# Patient Record
Sex: Male | Born: 1975 | Race: Black or African American | Hispanic: No | Marital: Married | Smoking: Never smoker
Health system: Southern US, Community
[De-identification: ages and names within clinical notes are randomized; demographics above are authoritative.]

## PROBLEM LIST (undated history)

## (undated) DIAGNOSIS — I471 Supraventricular tachycardia, unspecified: Secondary | ICD-10-CM

## (undated) DIAGNOSIS — I472 Ventricular tachycardia, unspecified: Secondary | ICD-10-CM

## (undated) HISTORY — PX: OTHER SURGICAL HISTORY: SHX169

---

## 2005-07-25 ENCOUNTER — Emergency Department (HOSPITAL_COMMUNITY): Admission: EM | Admit: 2005-07-25 | Discharge: 2005-07-25 | Payer: Self-pay | Admitting: Family Medicine

## 2006-12-10 ENCOUNTER — Emergency Department (HOSPITAL_COMMUNITY): Admission: EM | Admit: 2006-12-10 | Discharge: 2006-12-10 | Payer: Self-pay | Admitting: Emergency Medicine

## 2013-12-04 ENCOUNTER — Encounter: Payer: Self-pay | Admitting: Physician Assistant

## 2013-12-04 ENCOUNTER — Ambulatory Visit (INDEPENDENT_AMBULATORY_CARE_PROVIDER_SITE_OTHER): Payer: 59 | Admitting: Physician Assistant

## 2013-12-04 VITALS — BP 139/83 | HR 97 | Ht 73.0 in | Wt 264.0 lb

## 2013-12-04 DIAGNOSIS — Z0000 Encounter for general adult medical examination without abnormal findings: Secondary | ICD-10-CM

## 2013-12-04 DIAGNOSIS — Z131 Encounter for screening for diabetes mellitus: Secondary | ICD-10-CM

## 2013-12-04 DIAGNOSIS — Z23 Encounter for immunization: Secondary | ICD-10-CM

## 2013-12-04 DIAGNOSIS — Z13220 Encounter for screening for lipoid disorders: Secondary | ICD-10-CM

## 2013-12-04 NOTE — Patient Instructions (Signed)

## 2013-12-04 NOTE — Progress Notes (Signed)
   Subjective:    Patient ID: Xavier Johnston, male    DOB: 1975/11/28, 38 y.o.   MRN: [id number]  HPI Pt presents to the clinic to establish care and get routine CPE.   PMH negative. On no current medications.   .. Family History  Problem Relation Age of Onset  . Heart attack Father   . Diabetes Maternal Grandmother   . Cancer Paternal Grandmother    .Marland Kitchen History   Social History  . Marital Status: Married    Spouse Name: N/A    Number of Children: N/A  . Years of Education: N/A   Occupational History  . Not on file.   Social History Main Topics  . Smoking status: Never Smoker   . Smokeless tobacco: Not on file  . Alcohol Use: No  . Drug Use: No  . Sexual Activity: Yes   Other Topics Concern  . Not on file   Social History Narrative  . No narrative on file   Pt has no other concerns or complaints today.    Review of Systems  All other systems reviewed and are negative.      Objective:   Physical Exam  BP 139/83  Pulse 97  Ht  (1.854 m)  Wt 264 lb (119.75 kg)  BMI 34.84 kg/m2  General Appearance:    Alert, cooperative, no distress, appears stated age  Head:    Normocephalic, without obvious abnormality, atraumatic  Eyes:    PERRL, conjunctiva/corneas clear, EOM's intact, fundi    benign, both eyes       Ears:    Normal TM's and external ear canals, both ears  Nose:   Nares normal, septum midline, mucosa normal, no drainage    or sinus tenderness  Throat:   Lips, mucosa, and tongue normal; teeth and gums normal  Neck:   Supple, symmetrical, trachea midline, no adenopathy;       thyroid:  No enlargement/tenderness/nodules; no carotid   bruit or JVD  Back:     Symmetric, no curvature, ROM normal, no CVA tenderness  Lungs:     Clear to auscultation bilaterally, respirations unlabored  Chest wall:    No tenderness or deformity  Heart:    Regular rate and rhythm, S1 and S2 normal, no murmur, rub   or gallop  Abdomen:     Soft, non-tender, bowel  sounds active all four quadrants,    no masses, no organomegaly        Extremities:   Extremities normal, atraumatic, no cyanosis or edema  Pulses:   2+ and symmetric all extremities  Skin:   Skin color, texture, turgor normal, no rashes or lesions  Lymph nodes:   Cervical, supraclavicular, and axillary nodes normal  Neurologic:   CNII-XII intact. Normal strength, sensation and reflexes      throughout        Assessment & Plan:  CPE-  Screening labs were given to patient. HO for health maintence. Encouraged exercise and healthy daily diet. Discussed elevated BP. Per pt runs in the 120's over 80's when he checks it during the week.   Tdap given without complication.  Flu shot given without complications.

## 2013-12-08 LAB — COMPLETE METABOLIC PANEL WITH GFR
ALT: 11 U/L (ref 0–53)
AST: 15 U/L (ref 0–37)
Albumin: 4.5 g/dL (ref 3.5–5.2)
Alkaline Phosphatase: 71 U/L (ref 39–117)
BUN: 10 mg/dL (ref 6–23)
CO2: 32 mEq/L (ref 19–32)
CREATININE: 0.92 mg/dL (ref 0.50–1.35)
Calcium: 9.6 mg/dL (ref 8.4–10.5)
Chloride: 103 mEq/L (ref 96–112)
GLUCOSE: 100 mg/dL — AB (ref 70–99)
Potassium: 4 mEq/L (ref 3.5–5.3)
Sodium: 140 mEq/L (ref 135–145)
TOTAL PROTEIN: 7.5 g/dL (ref 6.0–8.3)
Total Bilirubin: 0.4 mg/dL (ref 0.2–1.2)

## 2013-12-08 LAB — LIPID PANEL
Cholesterol: 167 mg/dL (ref 0–200)
HDL: 34 mg/dL — AB (ref >39)
LDL Cholesterol: 101 mg/dL — ABNORMAL HIGH (ref 0–99)
TRIGLYCERIDES: 160 mg/dL — AB (ref <150)
Total CHOL/HDL Ratio: 4.9 Ratio
VLDL: 32 mg/dL (ref 0–40)

## 2014-05-12 ENCOUNTER — Encounter: Payer: Self-pay | Admitting: Physician Assistant

## 2014-05-12 ENCOUNTER — Ambulatory Visit (INDEPENDENT_AMBULATORY_CARE_PROVIDER_SITE_OTHER): Payer: BLUE CROSS/BLUE SHIELD | Admitting: Physician Assistant

## 2014-05-12 VITALS — BP 119/81 | HR 87 | Ht 73.0 in | Wt 258.0 lb

## 2014-05-12 DIAGNOSIS — Z0000 Encounter for general adult medical examination without abnormal findings: Secondary | ICD-10-CM

## 2014-05-12 DIAGNOSIS — Z13220 Encounter for screening for lipoid disorders: Secondary | ICD-10-CM | POA: Diagnosis not present

## 2014-05-12 DIAGNOSIS — Z131 Encounter for screening for diabetes mellitus: Secondary | ICD-10-CM | POA: Diagnosis not present

## 2014-05-12 NOTE — Patient Instructions (Signed)

## 2014-05-12 NOTE — Progress Notes (Signed)
   Subjective:    Patient ID: Xavier Johnston, male    DOB: 09/19/1975, 39 y.o.   MRN: [id number]  HPI Pt presents to the clinic for CPE.  .. Active Ambulatory Problems    Diagnosis Date Noted  . No Active Ambulatory Problems   Resolved Ambulatory Problems    Diagnosis Date Noted  . No Resolved Ambulatory Problems   No Additional Past Medical History   .Marland Kitchen. Family History  Problem Relation Age of Onset  . Heart attack Father   . Diabetes Maternal Grandmother   . Cancer Paternal Grandmother    .Marland Kitchen. History   Social History  . Marital Status: Married    Spouse Name: N/A  . Number of Children: N/A  . Years of Education: N/A   Occupational History  . Not on file.   Social History Main Topics  . Smoking status: Never Smoker   . Smokeless tobacco: Not on file  . Alcohol Use: No  . Drug Use: No  . Sexual Activity: Yes   Other Topics Concern  . Not on file   Social History Narrative   Pt has no compliants today. He is feeling great. Down 6lbs from last year.    Review of Systems  All other systems reviewed and are negative.      Objective:   Physical Exam  BP 119/81 mmHg  Pulse 87  Ht 6\' 1"  (1.854 m)  Wt 258 lb (117.028 kg)  BMI 34.05 kg/m2  General Appearance:    Alert, cooperative, no distress, appears stated age  Head:    Normocephalic, without obvious abnormality, atraumatic  Eyes:    PERRL, conjunctiva/corneas clear, EOM's intact, fundi    benign, both eyes       Ears:    Normal TM's and external ear canals, both ears  Nose:   Nares normal, septum midline, mucosa normal, no drainage    or sinus tenderness  Throat:   Lips, mucosa, and tongue normal; teeth and gums normal  Neck:   Supple, symmetrical, trachea midline, no adenopathy;       thyroid:  No enlargement/tenderness/nodules; no carotid   bruit or JVD  Back:     Symmetric, no curvature, ROM normal, no CVA tenderness  Lungs:     Clear to auscultation bilaterally, respirations unlabored  Chest  wall:    No tenderness or deformity  Heart:    Regular rate and rhythm, S1 and S2 normal, no murmur, rub   or gallop  Abdomen:     Soft, non-tender, bowel sounds active all four quadrants,    no masses, no organomegaly  Genitalia:  Not done.   Rectal:  Not done.  Extremities:   Extremities normal, atraumatic, no cyanosis or edema  Pulses:   2+ and symmetric all extremities  Skin:   Skin color, texture, turgor normal, no rashes or lesions  Lymph nodes:   Cervical, supraclavicular, and axillary nodes normal  Neurologic:   CNII-XII intact. Normal strength, sensation and reflexes      throughout        Assessment & Plan:  cPE- fasting labs were ordered. Vaccines up to date. HIV screening ordered. Discussed additional weight loss and adding in exercise. No urinary symptoms. Will fill out forms for work. HO for CPE given.

## 2014-05-13 ENCOUNTER — Encounter: Payer: Self-pay | Admitting: Physician Assistant

## 2014-05-13 DIAGNOSIS — E785 Hyperlipidemia, unspecified: Secondary | ICD-10-CM | POA: Insufficient documentation

## 2014-05-13 LAB — COMPLETE METABOLIC PANEL WITH GFR
ALK PHOS: 72 U/L (ref 39–117)
ALT: 12 U/L (ref 0–53)
AST: 15 U/L (ref 0–37)
Albumin: 4.6 g/dL (ref 3.5–5.2)
BILIRUBIN TOTAL: 0.5 mg/dL (ref 0.2–1.2)
BUN: 9 mg/dL (ref 6–23)
CO2: 26 mEq/L (ref 19–32)
CREATININE: 0.84 mg/dL (ref 0.50–1.35)
Calcium: 9.5 mg/dL (ref 8.4–10.5)
Chloride: 104 mEq/L (ref 96–112)
GFR, Est African American: >89 mL/min
GFR, Est Non African American: >89 mL/min
Glucose, Bld: 75 mg/dL (ref 70–99)
Potassium: 4.2 mEq/L (ref 3.5–5.3)
SODIUM: 139 meq/L (ref 135–145)
TOTAL PROTEIN: 8 g/dL (ref 6.0–8.3)

## 2014-05-13 LAB — LIPID PANEL
Cholesterol: 190 mg/dL (ref 0–200)
HDL: 37 mg/dL — ABNORMAL LOW (ref >=40)
LDL CALC: 134 mg/dL — AB (ref 0–99)
Total CHOL/HDL Ratio: 5.1 Ratio
Triglycerides: 93 mg/dL (ref <150)
VLDL: 19 mg/dL (ref 0–40)

## 2014-05-13 LAB — HIV ANTIBODY (ROUTINE TESTING W REFLEX): HIV: NONREACTIVE

## 2015-12-20 ENCOUNTER — Encounter: Payer: Self-pay | Admitting: *Deleted

## 2015-12-20 ENCOUNTER — Emergency Department (INDEPENDENT_AMBULATORY_CARE_PROVIDER_SITE_OTHER)
Admission: EM | Admit: 2015-12-20 | Discharge: 2015-12-20 | Disposition: A | Discharge status: Home / Self Care | Payer: BLUE CROSS/BLUE SHIELD | Source: Home / Self Care | Attending: Family Medicine | Admitting: Family Medicine

## 2015-12-20 DIAGNOSIS — J069 Acute upper respiratory infection, unspecified: Secondary | ICD-10-CM

## 2015-12-20 DIAGNOSIS — R51 Headache: Secondary | ICD-10-CM

## 2015-12-20 DIAGNOSIS — R519 Headache, unspecified: Secondary | ICD-10-CM

## 2015-12-20 MED ORDER — PREDNISONE 20 MG PO TABS: ORAL_TABLET | ORAL | 0 refills | Status: DC

## 2015-12-20 MED ORDER — FLUTICASONE PROPIONATE 50 MCG/ACT NA SUSP: 2.0000 | Freq: Every day | NASAL | 2 refills | Status: DC

## 2015-12-20 NOTE — ED Triage Notes (Signed)
Pt c/o gland in the LT side of his neck swollen x 3 days, with soreness behind his LT eye and runny nose since yesterday. He reports receiving a flu vaccine yesterday. Denies fever or vision change. He took Catering manageralka seltzer and sudafed yesterday yesterday.

## 2015-12-20 NOTE — ED Provider Notes (Signed)
CSN: [id number]     Arrival date & time 12/20/15  1847 History   First MD Initiated Contact with Patient 12/20/15 1903     Chief Complaint  Patient presents with  . Eye Pain   (Consider location/radiation/quality/duration/timing/severity/associated sxs/prior Treatment) HPI  Xavier Johnston is a 40 y.o. male presenting to UC with c/o Left sided headache behind his eye, Left sided neck pain with swollen gland that started 3 days ago and mild congestion with rhinorrhea.  He reports getting a flu vaccine yesterday but symptoms started before that.  He has tried OTC sudafed and Alka-seltzer which helps with symptoms but states symptoms come back by the end of the day.  Denies fever, chills, cough, n/v/d. No sick contacts or recent travel.     History reviewed. No pertinent past medical history. History reviewed. No pertinent surgical history. Family History  Problem Relation Age of Onset  . Heart attack Father   . Sudden Cardiac Death Father   . Diabetes Maternal Grandmother   . Cancer Paternal Grandmother    Social History  Substance Use Topics  . Smoking status: Never Smoker  . Smokeless tobacco: Never Used  . Alcohol use No    Review of Systems  Constitutional: Negative for chills and fever.  HENT: Positive for congestion, ear pain ( fullness), rhinorrhea, sinus pressure and sore throat ( scratchy). Negative for trouble swallowing and voice change.   Eyes: Positive for pain ( pain behind Left eye). Negative for photophobia, discharge, redness, itching and visual disturbance.  Respiratory: Negative for cough and shortness of breath.   Cardiovascular: Negative for chest pain and palpitations.  Gastrointestinal: Negative for abdominal pain, diarrhea, nausea and vomiting.  Musculoskeletal: Negative for arthralgias, back pain and myalgias.  Skin: Negative for rash.  Neurological: Positive for headaches (Left side of head). Negative for dizziness and light-headedness.    Allergies   Review of patient's allergies indicates no known allergies.  Home Medications   Prior to Admission medications   Medication Sig Start Date End Date Taking? Authorizing Provider  fluticasone (FLONASE) 50 MCG/ACT nasal spray Place 2 sprays into both nostrils daily. 12/20/15   Junius Finner, PA-C  predniSONE (DELTASONE) 20 MG tablet 3 tabs po day one, then 2 po daily x 4 days 12/20/15   Junius Finner, PA-C   Meds Ordered and Administered this Visit  Medications - No data to display  BP 134/76 (BP Location: Left Arm)   Pulse 79   Temp 98.4 F (36.9 C) (Oral)   Resp 16   Ht 6\' 1"  (1.854 m)   Wt 267 lb (121.1 kg)   SpO2 100%   BMI 35.23 kg/m  No data found.   Physical Exam  Constitutional: He is oriented to person, place, and time. He appears well-developed and well-nourished. No distress.  HENT:  Head: Normocephalic and atraumatic.  Right Ear: A middle ear effusion is present.  Left Ear: A middle ear effusion is present.  Nose: Nose normal. Right sinus exhibits no maxillary sinus tenderness and no frontal sinus tenderness. Left sinus exhibits no maxillary sinus tenderness and no frontal sinus tenderness.  Mouth/Throat: Uvula is midline, oropharynx is clear and moist and mucous membranes are normal.  Eyes: EOM are normal.  Neck: Normal range of motion.  Cardiovascular: Normal rate and regular rhythm.   Pulmonary/Chest: Effort normal and breath sounds normal. No respiratory distress. He has no wheezes. He has no rales.  Musculoskeletal: Normal range of motion.  Neurological: He is alert and  oriented to person, place, and time.  Skin: Skin is warm and dry. He is not diaphoretic.  Psychiatric: He has a normal mood and affect. His behavior is normal.  Nursing note and vitals reviewed.   Urgent Care Course   Clinical Course    Procedures (including critical care time)  Labs Review Labs Reviewed - No data to display  Imaging Review No results found.    MDM   1. Sinus  headache   2. Viral upper respiratory tract infection    No evidence of bacterial infection on exam. Symptoms likely viral in nature. Encouraged symptomatic treatment.  Rx: flonase and prednisone May continue OTC Sudafed Advised pt to use acetaminophen and ibuprofen as needed for fever and pain. Encouraged rest and fluids. F/u with PCP in 7-10 days if not improving, sooner if worsening. Pt verbalized understanding and agreement with tx plan.     Junius FinnerErin O'Malley, PA-C 12/20/15 2021

## 2015-12-20 NOTE — Discharge Instructions (Signed)

## 2019-10-22 ENCOUNTER — Other Ambulatory Visit: Payer: Self-pay

## 2019-10-22 DIAGNOSIS — Z20822 Contact with and (suspected) exposure to covid-19: Secondary | ICD-10-CM | POA: Insufficient documentation

## 2019-10-22 DIAGNOSIS — E669 Obesity, unspecified: Secondary | ICD-10-CM | POA: Insufficient documentation

## 2019-10-22 DIAGNOSIS — I4510 Unspecified right bundle-branch block: Secondary | ICD-10-CM | POA: Insufficient documentation

## 2019-10-22 DIAGNOSIS — R002 Palpitations: Secondary | ICD-10-CM | POA: Diagnosis not present

## 2019-10-22 DIAGNOSIS — I472 Ventricular tachycardia: Secondary | ICD-10-CM | POA: Diagnosis not present

## 2019-10-22 NOTE — ED Triage Notes (Signed)
Pt states about 20 minutes ago he started feeling like his heart was racing and he got hot and sweaty  Denies any pain

## 2019-10-23 ENCOUNTER — Observation Stay (HOSPITAL_BASED_OUTPATIENT_CLINIC_OR_DEPARTMENT_OTHER): Payer: BC Managed Care – PPO

## 2019-10-23 ENCOUNTER — Observation Stay (HOSPITAL_BASED_OUTPATIENT_CLINIC_OR_DEPARTMENT_OTHER)
Admission: EM | Admit: 2019-10-23 | Discharge: 2019-10-23 | Disposition: A | Discharge status: Home / Self Care | Payer: BC Managed Care – PPO | Attending: Cardiology | Admitting: Cardiology

## 2019-10-23 ENCOUNTER — Encounter (HOSPITAL_BASED_OUTPATIENT_CLINIC_OR_DEPARTMENT_OTHER): Payer: Self-pay | Admitting: Emergency Medicine

## 2019-10-23 DIAGNOSIS — R000 Tachycardia, unspecified: Secondary | ICD-10-CM

## 2019-10-23 DIAGNOSIS — I2489 Other forms of acute ischemic heart disease: Secondary | ICD-10-CM

## 2019-10-23 DIAGNOSIS — I248 Other forms of acute ischemic heart disease: Secondary | ICD-10-CM | POA: Diagnosis not present

## 2019-10-23 DIAGNOSIS — R9431 Abnormal electrocardiogram [ECG] [EKG]: Secondary | ICD-10-CM | POA: Diagnosis not present

## 2019-10-23 DIAGNOSIS — I4510 Unspecified right bundle-branch block: Secondary | ICD-10-CM | POA: Diagnosis not present

## 2019-10-23 DIAGNOSIS — I472 Ventricular tachycardia: Secondary | ICD-10-CM

## 2019-10-23 LAB — BASIC METABOLIC PANEL
Anion gap: 11 (ref 5–15)
BUN: 16 mg/dL (ref 6–20)
CO2: 27 mmol/L (ref 22–32)
Calcium: 9.8 mg/dL (ref 8.9–10.3)
Chloride: 101 mmol/L (ref 98–111)
Creatinine, Ser: 1.16 mg/dL (ref 0.61–1.24)
GFR calc Af Amer: >60 mL/min (ref >60)
GFR calc non Af Amer: >60 mL/min (ref >60)
Glucose, Bld: 116 mg/dL — ABNORMAL HIGH (ref 70–99)
Potassium: 3.6 mmol/L (ref 3.5–5.1)
Sodium: 139 mmol/L (ref 135–145)

## 2019-10-23 LAB — CBC WITH DIFFERENTIAL/PLATELET
Abs Immature Granulocytes: 0.02 10*3/uL (ref 0.00–0.07)
Basophils Absolute: 0.1 10*3/uL (ref 0.0–0.1)
Basophils Relative: 1 %
Eosinophils Absolute: 0.2 10*3/uL (ref 0.0–0.5)
Eosinophils Relative: 2 %
HCT: 42.9 % (ref 39.0–52.0)
Hemoglobin: 13.8 g/dL (ref 13.0–17.0)
Immature Granulocytes: 0 %
Lymphocytes Relative: 44 %
Lymphs Abs: 4.6 10*3/uL — ABNORMAL HIGH (ref 0.7–4.0)
MCH: 26.8 pg (ref 26.0–34.0)
MCHC: 32.2 g/dL (ref 30.0–36.0)
MCV: 83.5 fL (ref 80.0–100.0)
Monocytes Absolute: 0.8 10*3/uL (ref 0.1–1.0)
Monocytes Relative: 8 %
Neutro Abs: 5 10*3/uL (ref 1.7–7.7)
Neutrophils Relative %: 45 %
Platelets: 302 10*3/uL (ref 150–400)
RBC: 5.14 MIL/uL (ref 4.22–5.81)
RDW: 14.5 % (ref 11.5–15.5)
WBC: 10.7 10*3/uL — ABNORMAL HIGH (ref 4.0–10.5)
nRBC: 0 % (ref 0.0–0.2)

## 2019-10-23 LAB — ECHOCARDIOGRAM COMPLETE
Area-P 1/2: 3.03 cm2
Height: 73 in
S' Lateral: 2.8 cm
Weight: 4611.2 oz

## 2019-10-23 LAB — SARS CORONAVIRUS 2 BY RT PCR (HOSPITAL ORDER, PERFORMED IN ~~LOC~~ HOSPITAL LAB): SARS Coronavirus 2: NEGATIVE

## 2019-10-23 LAB — TSH: TSH: 1.964 u[IU]/mL (ref 0.350–4.500)

## 2019-10-23 LAB — TROPONIN I (HIGH SENSITIVITY)
Troponin I (High Sensitivity): 42 ng/L — ABNORMAL HIGH (ref <18)
Troponin I (High Sensitivity): 5 ng/L (ref <18)

## 2019-10-23 LAB — MAGNESIUM: Magnesium: 2.3 mg/dL (ref 1.7–2.4)

## 2019-10-23 MED ORDER — NITROGLYCERIN 0.4 MG SL SUBL: 0.4000 mg | SUBLINGUAL_TABLET | SUBLINGUAL | Status: DC | PRN

## 2019-10-23 MED ORDER — ASPIRIN EC 81 MG PO TBEC: 81.0000 mg | DELAYED_RELEASE_TABLET | Freq: Every day | ORAL | Status: DC

## 2019-10-23 MED ORDER — ONDANSETRON HCL 4 MG/2ML IJ SOLN: 4.0000 mg | Freq: Four times a day (QID) | INTRAMUSCULAR | Status: DC | PRN

## 2019-10-23 MED ORDER — ETOMIDATE 2 MG/ML IV SOLN: 0.1500 mg/kg | Freq: Once | INTRAVENOUS | Status: DC

## 2019-10-23 MED ORDER — ACETAMINOPHEN 325 MG PO TABS: 650.0000 mg | ORAL_TABLET | ORAL | Status: DC | PRN

## 2019-10-23 MED ORDER — ASPIRIN 300 MG RE SUPP: 300.0000 mg | RECTAL | Status: AC

## 2019-10-23 MED ORDER — ASPIRIN 81 MG PO CHEW
324.0000 mg | CHEWABLE_TABLET | ORAL | Status: AC
  Administered 2019-10-23: 324 mg via ORAL
  Filled 2019-10-23: qty 4

## 2019-10-23 MED ORDER — METOPROLOL SUCCINATE ER 50 MG PO TB24
50.0000 mg | ORAL_TABLET | Freq: Every day | ORAL | 6 refills | Status: DC
Start: 2019-10-23 — End: 2019-12-14

## 2019-10-23 NOTE — Plan of Care (Signed)
Pt stable. No complications other than last night SVT episode.

## 2019-10-23 NOTE — Progress Notes (Signed)
  Echocardiogram 2D Echocardiogram has been performed.  Xavier Johnston 10/23/2019, 1:44 PM

## 2019-10-23 NOTE — ED Notes (Signed)
Canceled sedation at this time - pt converted to sinus tach

## 2019-10-23 NOTE — ED Provider Notes (Addendum)
[facility] Provider Note: Xavier Dell, MD, FACEP  CSN: [id number] MRN: [id number] ARRIVAL: 10/22/19 at 2351 ROOM: MH06/[facility]06   CHIEF COMPLAINT  Palpitations   HISTORY OF PRESENT ILLNESS  10/23/19 12:05 AM Xavier Johnston is a 44 y.o. male without significant past medical history.  About 20 minutes prior to arrival he is felt his heart suddenly racing and he got hot and sweaty.  He did not have any chest pain or shortness of breath.  On arrival his EKG showed a wide complex tachycardia with a rate of 217, it is unclear if this represented ventricular tachycardia or SVT with aberrancy.  Preparations were being made for emergency cardioversion but he spontaneously converted to either sinus rhythm with right bundle branch block or atrial flutter with 2-1 block, I favor the former.  He is not aware of anything that brought this on nor did anything make his symptoms better or worse.  He denies any drug use.   History reviewed. No pertinent past medical history.  Past Surgical History:  Procedure Laterality Date  . vastectomy      Family History  Problem Relation Age of Onset  . Heart attack Father   . Sudden Cardiac Death Father   . Diabetes Maternal Grandmother   . Cancer Paternal Grandmother     Social History   Tobacco Use  . Smoking status: Never Smoker  . Smokeless tobacco: Never Used  Vaping Use  . Vaping Use: Never used  Substance Use Topics  . Alcohol use: No  . Drug use: No    Prior to Admission medications   Medication Sig Start Date End Date Taking? Authorizing Provider  fluticasone (FLONASE) 50 MCG/ACT nasal spray Place 2 sprays into both nostrils daily. 12/20/15   Lurene Shadow, PA-C  predniSONE (DELTASONE) 20 MG tablet 3 tabs po day one, then 2 po daily x 4 days 12/20/15   Lurene Shadow, PA-C    Allergies Patient has no known allergies.   REVIEW OF SYSTEMS  Negative except as noted here or in the History of Present  Illness.   PHYSICAL EXAMINATION  Initial Vital Signs Blood pressure (!) 172/114, pulse 77, temperature 98.7 F (37.1 C), temperature source Oral, resp. rate 20, height 6\' 1"  (1.854 m), weight 129.3 kg, SpO2 100 %.  Examination General: Well-developed, well-nourished male in no acute distress; appearance consistent with age of record HENT: normocephalic; atraumatic Eyes: pupils equal, round and reactive to light; extraocular muscles intact Neck: supple Heart: regular rate and rhythm; tachycardia Lungs: clear to auscultation bilaterally Abdomen: soft; nondistended; nontender; bowel sounds present Extremities: No deformity; full range of motion; pulses normal Neurologic: Awake, alert and oriented; motor function intact in all extremities and symmetric; no facial droop Skin: Diaphoretic Psychiatric: Anxious   RESULTS  Summary of this visit's results, reviewed and interpreted by myself:   EKG Interpretation  Date/Time:  Thursday October 22 2019 23:58:38 EDT Ventricular Rate:  217 PR Interval:    QRS Duration: 142 QT Interval:  252 QTC Calculation: 479 R Axis:   166 Text Interpretation:  Critical Test Result: Arrhythmia  Suspect arm lead reversal, interpretation assumes no reversal Wide QRS tachycardia Right bundle branch block Septal infarct , age undetermined Lateral infarct , age undetermined Abnormal ECG No previous ECGs available Confirmed by [id number] (Paula Libra) on 10/23/2019 12:03:29 AM       EKG Interpretation  Date/Time:  Friday October 23 2019 00:07:23 EDT Ventricular Rate:  119 PR Interval:  QRS Duration: 125 QT Interval:  328 QTC Calculation: 462 R Axis:   -101 Text Interpretation: Undetermined rhythm Right bundle branch block ST elev, probable normal early repol pattern Previously Wide-Complex Tachycardia Confirmed by Paula Libra ([id number]) on 10/23/2019 12:15:11 AM       Laboratory Studies: Results for orders placed or performed during the hospital encounter of  10/23/19 (from the past 24 hour(s))  CBC with Differential/Platelet     Status: Abnormal   Collection Time: 10/23/19 12:08 AM  Result Value Ref Range   WBC 10.7 (H) 4.0 - 10.5 K/uL   RBC 5.14 4.22 - 5.81 MIL/uL   Hemoglobin 13.8 13.0 - 17.0 g/dL   HCT 37.2 39 - 52 %   MCV 83.5 80.0 - 100.0 fL   MCH 26.8 26.0 - 34.0 pg   MCHC 32.2 30.0 - 36.0 g/dL   RDW 90.2 11.1 - 55.2 %   Platelets 302 150 - 400 K/uL   nRBC 0.0 0.0 - 0.2 %   Neutrophils Relative % 45 %   Neutro Abs 5.0 1.7 - 7.7 K/uL   Lymphocytes Relative 44 %   Lymphs Abs 4.6 (H) 0.7 - 4.0 K/uL   Monocytes Relative 8 %   Monocytes Absolute 0.8 0 - 1 K/uL   Eosinophils Relative 2 %   Eosinophils Absolute 0.2 0 - 0 K/uL   Basophils Relative 1 %   Basophils Absolute 0.1 0 - 0 K/uL   Immature Granulocytes 0 %   Abs Immature Granulocytes 0.02 0.00 - 0.07 K/uL  Basic metabolic panel     Status: Abnormal   Collection Time: 10/23/19 12:08 AM  Result Value Ref Range   Sodium 139 135 - 145 mmol/L   Potassium 3.6 3.5 - 5.1 mmol/L   Chloride 101 98 - 111 mmol/L   CO2 27 22 - 32 mmol/L   Glucose, Bld 116 (H) 70 - 99 mg/dL   BUN 16 6 - 20 mg/dL   Creatinine, Ser 0.80 0.61 - 1.24 mg/dL   Calcium 9.8 8.9 - 22.3 mg/dL   GFR calc non Af Amer >60 >60 mL/min   GFR calc Af Amer >60 >60 mL/min   Anion gap 11 5 - 15  Troponin I (High Sensitivity)     Status: None   Collection Time: 10/23/19 12:08 AM  Result Value Ref Range   Troponin I (High Sensitivity) 5 <18 ng/L  Magnesium     Status: None   Collection Time: 10/23/19 12:08 AM  Result Value Ref Range   Magnesium 2.3 1.7 - 2.4 mg/dL  Troponin I (High Sensitivity)     Status: Abnormal   Collection Time: 10/23/19  2:30 AM  Result Value Ref Range   Troponin I (High Sensitivity) 42 (H) <18 ng/L   Imaging Studies: No results found.  ED COURSE and MDM  Nursing notes, initial and subsequent vitals signs, including pulse oximetry, reviewed and interpreted by myself.  Vitals:    10/23/19 0145 10/23/19 0200 10/23/19 0215 10/23/19 0300  BP: (!) 133/98 (!) 127/92 (!) 132/99 (!) 126/95  Pulse: (!) 110 (!) 112 (!) 108 (!) 103  Resp:  16 (!) 22 (!) 21  Temp:      TempSrc:      SpO2: 98% 100% 99% 97%  Weight:      Height:       Medications - No data to display  2:42 AM Patient has remained in sinus tachycardia with right bundle branch block.  Discussed with Dr. Charna Busman  of cardiology.  She will admit for overnight observation as it is unclear exactly what tachyarrhythmia he was in.  She agrees that this was likely SVT with aberrancy.  3:35 AM Elevated troponin is consistent with demand ischemia as tachycardia.  PROCEDURES  Procedures   ED DIAGNOSES     ICD-10-CM   1. Wide-complex tachycardia (HCC)  I47.2   2. RBBB  I45.10   3. Demand ischemia (HCC)  I24.8        Rayleigh Gillyard, MD 10/23/19 0246    Paula Libra, MD 10/23/19 [phone]

## 2019-10-23 NOTE — Discharge Summary (Signed)
ELECTROPHYSIOLOGY PROCEDURE DISCHARGE SUMMARY    Patient ID: Xavier Johnston,  MRN: [id number], DOB/AGE: 1975-10-23 44 y.o.  Admit date: 10/23/2019 Discharge date: 10/23/2019  Primary Care Physician: Maud Deed, PA  Primary Cardiologist/Electrophysiologist: new to [facility], Dr. Lalla Brothers  Primary Discharge Diagnosis:  1. Palpitations 2. WCT  Secondary Discharge Diagnosis:  1. obesity  No Known Allergies   Procedures This Admission:  none  Brief HPI: Xavier Johnston is a 44 y.o. male  with no noted PMHx with sudden onset of palpitations sought attention at the ER, he was noted in a WCT at 207bpm, while being prepared for cardioversion the tachycardia spontaneously converted to ST.  He was transferred to [facility] for EP evaluation.  Hospital Course:  The patient reported He was seated last night wrapping up the evening talking with his wife, went to get a blanket from the closet and his heart started to race.  He felt a bit winded with it, no CP.  He went to sit to see if it would settle, is did not, broke into a cold sweat.  His wife mentions profusely sweating.  At no time did he feel lightheaded or weak, after about 20 minutes decided to seek attention.  While in the [facility] ER they were discussing with him plans to cardiovert him and then commented that the rhythm had broke.  He did at that time feel like he did feel better, and could tell.  He reports diaphoresis, but no dizziness, near syncope or syncope.  His BP elevated on arrival to the ER  He was admitted and evaluated by Dr. Lalla Brothers.  His labs largely unremarkable except his HS trop 5 > 42. He had no CP and these were suspect to be demand ischemia 2/2 to about 45 minutes of extreme tachycardia.  His post conversion EKG with RBBB like morhology. His WCT suspect to most likely represent an SVT, though Dr. Lalla Brothers discussed unable to know for sure by his EKG alone and recommended out patient follow up with EP study and coronary  CT to f/u on his abnormal HS Trop  TTE was done noting LVEF 60-65%, no WMA, no VHD, mild dilation of AO root 82mm, he feels well with no further arrhythmias He is being discharged on Toprol XL 50mg  daily, f/u is in place.   TPhysical Exam: Vitals:   10/23/19 0645 10/23/19 0700 10/23/19 0844 10/23/19 1100  BP: (!) 101/59 (!) 101/58 (!) 134/97 123/86  Pulse: 80 78 87 83  Resp: 16 16 18    Temp:   98 F (36.7 C)   TempSrc:   Tympanic   SpO2: 97% 97% 99% 100%  Weight:   130.7 kg   Height:   6\' 1"  (1.854 m)      Labs:   Lab Results  Component Value Date   WBC 10.7 (H) 10/23/2019   HGB 13.8 10/23/2019   HCT 42.9 10/23/2019   MCV 83.5 10/23/2019   PLT 302 10/23/2019    Recent Labs  Lab 10/23/19 0008  NA 139  K 3.6  CL 101  CO2 27  BUN 16  CREATININE 1.16  CALCIUM 9.8  GLUCOSE 116*    Discharge Medications:  Allergies as of 10/23/2019   No Known Allergies     Medication List    TAKE these medications   fluticasone 50 MCG/ACT nasal spray Commonly known as: FLONASE Place 2 sprays into both nostrils daily.   metoprolol succinate 50 MG 24 hr tablet Commonly  known as: Toprol XL Take 1 tablet (50 mg total) by mouth daily. Take with or immediately following a meal.   naproxen 250 MG tablet Commonly known as: NAPROSYN Take 500 mg by mouth 2 (two) times daily with a meal.   One A Day Mens VitaCraves Chew Chew 2 tablets by mouth daily.       Disposition: Home Discharge Instructions    Diet - low sodium heart healthy   Complete by: As directed    Increase activity slowly   Complete by: As directed       Follow-up Information    Lanier Prude, MD Follow up.   Specialties: Interventional Cardiology, Cardiology Why: 11/16/2019 @ 9:20AM Contact information: [address] [place] [address] [phone]               Duration of Discharge Encounter: Greater than 30 minutes including physician time.  Norma Fredrickson,  PA-C 10/23/2019 4:34 PM

## 2019-10-23 NOTE — Plan of Care (Signed)
Pt discharging home.

## 2019-10-23 NOTE — Progress Notes (Signed)
Patient transferred via [facility] at 0844hrs.  Oriented to unit and plan of care for shift.  Patient verbalized understanding.  Cardiology notified of arrival.

## 2019-10-23 NOTE — H&P (Addendum)
Cardiology Admission History and Physical:   Patient ID: Xavier Johnston MRN: [id number]; DOB: 10/24/1975   Admission date: 10/23/2019  Primary Care Provider: Maud Deed, PA [facility] Cardiologist: [facility] Electrophysiologist:  None , new to [facility]  Chief Complaint:  palpitations  Patient Profile:   Xavier Johnston is a 43 y.o. male with no noted PMHx with sudden onset of palpitations.  History of Present Illness:   Xavier Johnston had a usual day for him yesterday, no unusual meals or activities.  He works from home, did a few things during the day, though nothing unusual or stressful.  He skipped lunch but had a normal dinner.  NO unusual caffeine intake, no ETOH.  No new medicines or vitamins of any kind. He denies any smoking or drugs, he drinks ETOH infrequently  He was seated last night wrapping up the evening talking with his wife, went to get a blanket from the closet and his heart started to race.  He felt a bit winded with it, no CP.  He went to sit to see if it would settle, is did not, broke into a cold sweat.  His wife mentions profusely sweating.  At no time did he feel lightheaded or weak, after about 20 minutes decided to seek attention.  While in the [facility] ER they were discussing with him plans to cardiovert him and then commented that the rhythm had broke.  He did at that time feel like he did feel better, and could tell.  He reports diaphoresis, but no dizziness, near syncope or syncope.  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  He and his wife try to walk for exercise in their neighborhood thought this is on again off again.  He does make efforts to park far away at stores to get some steps in.  He denies any kind of CP, DOE or difficulties with his ADLS.  No CP with his tachycardia or since.  He had a stress echo in 2010-07-27, this was after the death of his father who was in his early 66's from  heart disease (by autopsy) and he wanted to get his heart checked.  No other cardiac evaluatins.  He takes no home medicine regularly   LABS K+ 3.6 Mag 2.3 HS trop 5 > 42 WBC 10.7 H/H 13/42 Plts 302  COVID neg   History reviewed. No pertinent past medical history.  Past Surgical History:  Procedure Laterality Date  . vastectomy       Medications Prior to Admission: Prior to Admission medications   Medication Sig Start Date End Date Taking? Authorizing Provider  Multiple Vitamins-Minerals (ONE A DAY MENS VITACRAVES) CHEW Chew 2 tablets by mouth daily.   Yes [provider]  naproxen (NAPROSYN) 250 MG tablet Take 500 mg by mouth 2 (two) times daily with a meal.   Yes [provider]  fluticasone (FLONASE) 50 MCG/ACT nasal spray Place 2 sprays into both nostrils daily. Patient not taking: Reported on 10/23/2019 12/20/15   Lurene Shadow, PA-C     Allergies:   No Known Allergies  Social History:   Social History   Socioeconomic History  . Marital status: Married    Spouse name: Not on file  . Number of children: Not on file  . Years of education: Not on file  . Highest education level: Not on file  Occupational History  .  Not on file  Tobacco Use  . Smoking status: Never Smoker  . Smokeless tobacco: Never Used  Vaping Use  . Vaping Use: Never used  Substance and Sexual Activity  . Alcohol use: No  . Drug use: No  . Sexual activity: Yes  Other Topics Concern  . Not on file  Social History Narrative  . Not on file   Social Determinants of Health   Financial Resource Strain:   . Difficulty of Paying Living Expenses:   Food Insecurity:   . Worried About Programme researcher, broadcasting/film/video in the Last Year:   . Barista in the Last Year:   Transportation Needs:   . Freight forwarder (Medical):   Marland Kitchen Lack of Transportation (Non-Medical):   Physical Activity:   . Days of Exercise per Week:   . Minutes of Exercise per Session:   Stress:   .  Feeling of Stress :   Social Connections:   . Frequency of Communication with Friends and Family:   . Frequency of Social Gatherings with Friends and Family:   . Attends Religious Services:   . Active Member of Clubs or Organizations:   . Attends Banker Meetings:   Marland Kitchen Marital Status:   Intimate Partner Violence:   . Fear of Current or Ex-Partner:   . Emotionally Abused:   Marland Kitchen Physically Abused:   . Sexually Abused:     Family History:   The patient's family history includes Cancer in his paternal grandmother; Diabetes in his maternal grandmother; Heart attack in his father; Sudden Cardiac Death in his father.    ROS:  Please see the history of present illness.  All other ROS reviewed and negative.     Physical Exam/Data:   Vitals:   10/23/19 0630 10/23/19 0645 10/23/19 0700 10/23/19 0844  BP: 112/61 (!) 101/59 (!) 101/58 (!) 134/97  Pulse: 81 80 78 87  Resp: 17 16 16 18   Temp:    98 F (36.7 C)  TempSrc:    Tympanic  SpO2: 98% 97% 97% 99%  Weight:    130.7 kg  Height:    6\' 1"  (1.854 m)   No intake or output data in the 24 hours ending 10/23/19 1030 Last 3 Weights 10/23/2019 10/22/2019 12/20/2015  Weight (lbs) 288 lb 3.2 oz 285 lb 267 lb  Weight (kg) 130.727 kg 129.275 kg 121.11 kg     Body mass index is 38.02 kg/m.  General:  Well nourished, well developed, in no acute distress HEENT: normal Lymph: no adenopathy Neck: no JVD Endocrine:  No thryomegaly Vascular: No carotid bruits Cardiac:  RRR; no murmurs, gallops or rubs Lungs:  CTA b/l, no wheezing, rhonchi or rales  Abd: soft, nontender, obese  Ext: no  edema Musculoskeletal:  No deformities Skin: warm and dry  Neuro:  no gross focal motor abnormalities noted Psych:  Normal affect    EKG:  personally reviewed and demonstrates  WCT 217bpm, RBBB ST 119bpm, RBBB, I do not appreciate any pre-excitation  06/10/2010: SR, normal intervals, no pre-excitation  Telemetry  SR 70's-80's  Relevant CV  Studies:  07/06/2010 stress echo LVEF 55-60% at rest, no VHD Stress EF 60-65%, no echo evidence of CAD  Laboratory Data:  High Sensitivity Troponin:   Recent Labs  Lab 10/23/19 0008 10/23/19 0230  TROPONINIHS 5 42*      Chemistry Recent Labs  Lab 10/23/19 0008  NA 139  K 3.6  CL 101  CO2 27  GLUCOSE 116*  BUN 16  CREATININE 1.16  CALCIUM 9.8  GFRNONAA >60  GFRAA >60  ANIONGAP 11    No results for input(s): PROT, ALBUMIN, AST, ALT, ALKPHOS, BILITOT in the last 168 hours. Hematology Recent Labs  Lab 10/23/19 0008  WBC 10.7*  RBC 5.14  HGB 13.8  HCT 42.9  MCV 83.5  MCH 26.8  MCHC 32.2  RDW 14.5  PLT 302   BNPNo results for input(s): BNP, PROBNP in the last 168 hours.  DDimer No results for input(s): DDIMER in the last 168 hours.   Radiology/Studies:  No results found. {  Assessment and Plan:   1. WCT 2. RBBB (new from 2012) 3. Abnormal HS Trop  Suspect an SVT though not certain HS trop likely 2/2 45 minutes of HR 200's, no symptoms of angina Get echo Dr. Lalla Brothers will see the patient later today   Severity of Illness: The appropriate patient status for this patient is OBSERVATION. Observation status is judged to be reasonable and necessary in order to provide the required intensity of service to ensure the patient's safety. The patient's presenting symptoms, physical exam findings, and initial radiographic and laboratory data in the context of their medical condition is felt to place them at decreased risk for further clinical deterioration. Furthermore, it is anticipated that the patient will be medically stable for discharge from the hospital within 2 midnights of admission. The following factors support the patient status of observation.      For questions or updates, please contact [facility] Please consult [email or web address] for contact info under     Signed, Sheilah Pigeon, PA-C  10/23/2019 10:30 AM

## 2019-10-23 NOTE — Discharge Instructions (Signed)
Left Bundle Branch Block  Left bundle branch block (LBBB) is a problem with the way that electrical impulses pass through the heart (electrical conduction abnormality). The heart depends on an electrical pulse to beat normally. The electrical signal for a heartbeat starts in the upper chambers of the heart (atria) and then travels to the two lower chambers (left and rightventricles). An LBBB is a partial or complete block of the pathway that carries the signal to the left ventricle. If you have LBBB, the left side of your heart does not beat normally. LBBB may be a warning sign of heart disease. What are the causes? This condition may be caused by:  Heart disease.  Disease of the arteries in the heart (arteriosclerosis).  Stiffening or weakening of heart muscle (cardiomyopathy).  Infection of heart muscle (myocarditis).  High blood pressure. In some cases, the cause may not be known. What increases the risk? The following factors may make you more likely to develop this condition:  Being male.  Being 42 years of age or older.  Having heart disease.  Having had a heart attack or heart surgery. What are the signs or symptoms? This condition may not cause any symptoms. If you do have symptoms, they may include:  Feeling dizzy or light-headed.  Fainting. How is this diagnosed? This condition may be diagnosed based on an electrocardiogram (ECG). It is often diagnosed when an ECG is done as part of a routine physical or to help find the cause of fainting spells. You may also have imaging tests to find out more about your condition. These may include:  Chest X-rays.  Echocardiogram. How is this treated? If you do not have symptoms or any other type of heart disease, you may not need treatment for this condition. However, you may need to see your health care provider more often because LBBB can be a warning sign of future heart problems. You may get treatment for other heart  problems or high blood pressure. If LBBB causes symptoms or other heart problems, you may need to have an electrical device (pacemaker) implanted under the skin of your chest. A pacemaker sends electrical signals to your heart to keep it beating normally. Follow these instructions at home: Lifestyle   Follow instructions from your health care provider about eating or drinking restrictions.  Follow a heart-healthy diet and maintain a healthy weight. Work with a dietitian to create an eating plan that is best for you.  Do not use any products that contain nicotine or tobacco, such as cigarettes, e-cigarettes, and chewing tobacco. If you need help quitting, ask your health care provider. Activity  Get regular exercise as told by your health care provider.  Return to your normal activities as told by your health care provider. Ask your health care provider what activities are safe for you. General instructions  Take over-the-counter and prescription medicines only as told by your health care provider.  Keep all follow-up visits as told by your health care provider. This is important. Contact a health care provider if:  You feel light-headed.  You faint. Get help right away if:  You have chest pain.  You have trouble breathing. These symptoms may represent a serious problem that is an emergency. Do not wait to see if the symptoms will go away. Get medical help right away. Call your local emergency services (911 in the [place] Do not drive yourself to the hospital. Summary  For the heart to beat normally, an electrical signal  must travel to the lower left chamber of the heart. Left bundle branch block (LBBB) is a partial or complete block of the pathway that carries that signal.  This condition may not cause any symptoms. In some cases, a person may feel dizzy or light-headed or may faint.  Treatment may not be needed for LBBB if you do not have symptoms or any other type of heart  disease.  You may need to see your health care provider more often because LBBB can be a warning sign of future heart problems. This information is not intended to replace advice given to you by your health care provider. Make sure you discuss any questions you have with your health care provider. Document Revised: 09/02/2018 Document Reviewed: 09/02/2018 Elsevier Patient Education  2020 [employer]   Right Bundle Branch Block  Right bundle branch block (RBBB) is a problem with the way that electrical impulses pass through the heart (electrical conduction abnormality). The heart depends on an electrical pulse to beat normally. The electrical signal for a heartbeat starts in the upper chambers of the heart (atria) and then travels to the two lower chambers (left and rightventricles). An RBBB is a partial or complete block of the pathway that carries the signal to the right ventricle. If you have RBBB, the right side of your heart beats a little more slowly than the left side. RBBB may be a warning of heart disease or a lung problem. What are the causes? This condition may be caused by:  Heart attack (myocardial infarction).  Being born with a heart defect (congenital heart disease).  A blood clot that flows into the lung (pulmonary embolism).  Infection of heart muscle (myocarditis).  High blood pressure. In some cases, the cause may not be known. What increases the risk? The following factors may make you more likely to develop this condition:  Being male.  Being 74 years of age or older.  Having heart disease.  Having had a heart attack or heart surgery.  Having an enlarged heart.  Having a hole in the walls between the chambers of the heart (septal defect). What are the signs or symptoms? This condition does not typically cause symptoms. How is this diagnosed? This condition may be diagnosed based on an electrocardiogram (ECG). It is often diagnosed when an ECG is done  as part of a routine physical. You may also have imaging tests to find out more about your condition. These may include:  Chest X-rays.  Echocardiogram. How is this treated? Treatment may not be needed for this condition if you do not have symptoms or any other heart problems. However, you may need to see your health care provider more often because RBBB can be a warning sign of future heart or lung problems. You may need treatment for another condition that may be causing RBBB. Follow these instructions at home: Lifestyle   Follow instructions from your health care provider about eating or drinking restrictions.  Follow a heart-healthy diet and maintain a healthy weight. Work with a dietitian to create an eating plan that is best for you.  Do not use any products that contain nicotine or tobacco, such as cigarettes, e-cigarettes, and chewing tobacco. If you need help quitting, ask your health care provider. Activity  Get regular exercise as told by your health care provider.  Return to your normal activities as told by your health care provider. Ask your health care provider what activities are safe for you. General instructions  Take over-the-counter and prescription medicines only as told by your health care provider.  Keep all follow-up visits as told by your health care provider. This is important. Contact a health care provider if:  You are light-headed.  You faint. Get help right away if:  You have chest pain.  You have trouble breathing. These symptoms may represent a serious problem that is an emergency. Do not wait to see if the symptoms will go away. Get medical help right away. Call your local emergency services (911 in the [place] Do not drive yourself to the hospital.  Summary  For the heart to beat normally, an electrical signal must travel to the heart's lower right chamber. Right bundle branch block (RBBB) is a partial or complete block of the pathway that  carries that signal.  This condition does not typically cause symptoms.  Treatment may not be needed for RBBB if you do not have symptoms or any other heart problems.  You may need to see your health care provider more often because RBBB can be a warning sign of future heart or lung problems. This information is not intended to replace advice given to you by your health care provider. Make sure you discuss any questions you have with your health care provider. Document Revised: 09/02/2018 Document Reviewed: 09/02/2018 Elsevier Patient Education  2020 [employer]   Ventricular Tachycardia  Ventricular tachycardia is a fast heartbeat that begins in the lower chambers of the heart (ventricles). It is a type of abnormal heart rhythm (arrhythmia). A normal heartbeat usually starts when an area in the heart called the sinoatrial (SA) node releases an electrical signal. With ventricular tachycardia, electrical signals in the lower part of the heart fire abnormally and interfere with the electrical signals sent out by the SA node. A normal heart rate is 60-100 beats per minute. During an episode of ventricular tachycardia, the heart reaches 100 beats per minute or higher. This condition can be life-threatening and should be treated immediately. What are the causes? This condition is caused by abnormal electrical activity in the lower part of the heart. This may result from:  Medicines.  Diseases of the heart muscle (cardiomyopathy).  The heart not getting enough oxygen. This may be caused by blood flow problems in the arteries.  An inflammatory disease that affects multiple areas of the body (sarcoidosis).  Drug use, such as cocaine, amphetamine, or anabolic steroid use. What increases the risk? You are more likely to develop this condition if:  You have had a heart attack.  You have: ? Heart failure or cardiomyopathy. ? Heart defects that you were born with (congenital heart  defects). ? Abnormal heart tissue. ? Heart valves that leak or are narrow. ? Diabetes. ? An infection that affects the heart. ? High blood pressure. ? An overactive or underactive thyroid. ? Sleep apnea. ? A family history of stopped heartbeat (cardiac arrest) or coronary artery disease. ? High cholesterol.  You smoke.  You drink alcohol heavily.  You use drugs, such as cocaine. What are the signs or symptoms? Symptoms of this condition include:  A pounding heartbeat.  Feeling as if your heart is skipping beats or fluttering (palpitations).  Shortness of breath.  Anxiety.  Dizziness.  Light-headedness.  Fainting.  Chest pain.  Cardiac arrest caused by an irregular heartbeat (arrhythmia). How is this diagnosed? This condition may be diagnosed based on:  Your symptoms and medical history.  A physical exam.  Electrocardiogram (ECG). This test is done to  check for problems with electrical activity in the heart.  Holter monitor or event monitor test. This test involves wearing a portable device that monitors your heart rate over time. You may also have other tests, including:  Blood tests.  Chest X-ray.  Echocardiogram. This test involves using sound waves to create images of the heart.  Angiogram. During this test, dye is injected into your bloodstream, and then X-rays are taken. The dye lets your health care provider see how blood flows through your arteries.  Exercise stress test. During this test, an ECG is done while you exercise on a treadmill.  Cardiac CT scan or cardiac MRI. How is this treated? Treatment for this condition depends on the cause. Treatment may include:  Medicines that slow the heart rate and return it to a normal rhythm (anti-arrhythmics).  An electric shock (cardioversion) that makes the heart go back to a normal rhythm.  An electrophysiology study. This procedure can help locate areas of heart tissue that are causing rapid  heartbeats. ? In this procedure, a thin, flexible tube (catheter) is inserted into one of your veins and moved to your heart to evaluate your heart's electrical activity. ? In some cases, the heart tissue that is causing problems may be killed with radiofrequency energy delivered through the catheter (radiofrequency ablation). This may help your heart keep a normal rhythm.  An implantable cardioverter defibrillator (ICD). This is a small device that monitors your heartbeat. When it senses an irregular heartbeat, it sends a shock to bring the heartbeat back to normal. The ICD is implanted under the skin in the chest.  Surgery to improve blood flow to the heart.  Genetic counseling to check whether your family members are at risk for ventricular tachycardia. Follow these instructions at home: Lifestyle  Do not use any products that contain nicotine or tobacco, such as cigarettes and e-cigarettes. If you need help quitting, ask your health care provider.  Do not use stimulant drugs, such as cocaine or methamphetamines.  Maintain a healthy weight.  Manage stress. Try to do this with relaxation exercises, yoga, quiet time, or meditation. Eating and drinking  Eat a healthy diet. This includes plenty of fruits and vegetables, whole grains, lean meats, and low-fat or fat-free dairy products.  Avoid eating foods that are high in saturated fat, trans fat, sugar, or salt (sodium).  Ask your health care provider if you may drink alcohol. ? If alcohol triggers episodes of ventricular tachycardia, do not drink alcohol. ? If alcohol does not trigger episodes, limit alcohol intake to no more than 1 drink a day for nonpregnant women and 2 drinks a day for men. One drink equals 12 oz of beer, 5 oz of wine, or 1 oz of hard liquor. General instructions  Take over-the-counter and prescription medicines only as told by your health care provider.  Exercise regularly. Aim for 150 minutes of moderate  exercise or 75 minutes of vigorous exercise per week. Ask your health care provider what exercises are safe for you.  Keep all follow-up visits as told by your health care provider. This is important. Contact a health care provider if:  Your symptoms get worse.  You develop new symptoms, such as new palpitations.  You feel depressed. Get help right away if:  You have an episode of ventricular tachycardia that lasts 30 seconds or more.  You have chest pain.  You have trouble breathing. These symptoms may represent a serious problem that is an emergency. Do not wait  to see if the symptoms will go away. Get medical help right away. Call your local emergency services (911 in the [place] Do not drive yourself to the hospital. Summary  Ventricular tachycardia is a fast heartbeat that begins in the lower chambers of the heart. This condition can be life-threatening and should be treated immediately.  This condition may be treated with medicines, electric shock, radiofrequency energy, surgery, or insertion of an implantable cardioverter defibrillator (ICD).  Get help right away if you have chest pain, trouble breathing, or ventricular tachycardia symptoms that last more than 30 seconds. This information is not intended to replace advice given to you by your health care provider. Make sure you discuss any questions you have with your health care provider. Document Revised: 02/15/2017 Document Reviewed: 04/26/2016 Elsevier Patient Education  2020 [employer] Inc[employer]   Postural Orthostatic Tachycardia Syndrome Postural orthostatic tachycardia syndrome (POTS) is a group of symptoms that occur when a person stands up after lying down. POTS occurs when less blood than normal flows to the body when you stand up. The reduced blood flow to the body makes the heart beat rapidly. POTS may be associated with another medical condition, or it may occur on its own. What are the causes? The cause of this  condition is not known, but many conditions and diseases are associated with it. What increases the risk? This condition is more likely to develop in:  Women 30-23 years old.  Women who are pregnant.  Women who are in their period (menstruating).  People who have certain conditions, such as: ? Infection from a virus. ? Attacks of healthy organs by the body's immunity (autoimmune disease). ? Losing a lot of red blood cells (anemia). ? Losing too much water in the body (dehydration). ? An overactive thyroid (hyperthyroidism).  People who take certain medicines.  People who have had a major injury.  People who have had surgery. What are the signs or symptoms? The most common symptom of this condition is light-headedness when one stands from a lying or sitting position. Other symptoms may include:  Feeling a rapid increase in the heartbeat (tachycardia) within 10 minutes of standing up.  Fainting.  Weakness.  Confusion.  Trembling.  Shortness of breath.  Sweating or flushing.  Headache.  Chest pain.  Breathing that is deeper and faster than normal (hyperventilation).  Nausea.  Anxiety. Symptoms may be worse in the morning, and they may be relieved by lying down. How is this diagnosed? This condition is diagnosed based on:  Your symptoms.  Your medical history.  A physical exam.  Checking your heart rate when you are lying down and after you stand up.  Checking your blood pressure when you go from lying down to standing up.  Blood tests to measure hormones that change with blood pressure. The blood tests will be done when you are lying down and when you are standing up. You may have other tests to check for conditions or diseases that are associated with POTS. How is this treated? Treatment for this condition depends on how severe your symptoms are and whether you have any conditions or diseases that are associated with POTS. Treatment may  involve:  Treating any conditions or diseases that are associated with POTS.  Drinking two glasses of water before getting up from a lying position.  Eating more salt (sodium).  Taking medicine to control blood pressure and heart rate (beta-blocker).  Avoiding certain medicines.  Starting an exercise program under the supervision of  a health care provider. Follow these instructions at home: Medicines  Take over-the-counter and prescription medicines only as told by your health care provider.  Let your health care provider know about all prescription or over-the-counter medicines. These include herbs, vitamins, and supplements. You may need to stop or adjust some medicines if they cause this condition.  Talk with your health care provider before starting any new medicines. Eating and drinking   Drink enough fluid to keep your urine pale yellow.  If told by your health care provider, drink two glasses of water before getting up from a lying position.  Follow instructions from your health care provider about how much sodium you should eat.  Avoid heavy meals. Eat several small meals a day instead of a few large meals. General instructions  Do an aerobic exercise for 20 minutes a day, at least 3 days a week.  Ask your health care provider what kinds of exercise are safe for you.  Do not use any products that contain nicotine or tobacco, such as cigarettes and e-cigarettes. These can interfere with blood flow. If you need help quitting, ask your health care provider.  Keep all follow-up visits as told by your health care provider. This is important. Contact a health care provider if:  Your symptoms do not improve after treatment.  Your symptoms get worse.  You develop new symptoms. Get help right away if:  You have chest pain.  You have difficulty breathing.  You have fainting episodes. These symptoms may represent a serious problem that is an emergency. Do not wait  to see if the symptoms will go away. Get medical help right away. Call your local emergency services (911 in the [place] Do not drive yourself to the hospital. Summary  POTS is a condition that can cause light-headedness, fainting, and palpitations when you go from a sitting or lying position to a standing position. It occurs when less blood than normal flows to the body when you stand up.  Treatment for this condition includes treating any underlying conditions, drinking plenty of water, stopping or changing some medicines, or starting an exercise program.  Get help right away if you have chest pain, difficulty breathing, or fainting episodes. These may represent a serious problem that is an emergency. This information is not intended to replace advice given to you by your health care provider. Make sure you discuss any questions you have with your health care provider. Document Revised: 04/16/2017 Document Reviewed: 04/16/2017 Elsevier Patient Education  2020 [employer]   Sinus Tachycardia  Sinus tachycardia is a kind of fast heartbeat. In sinus tachycardia, the heart beats more than 100 times a minute. Sinus tachycardia starts in a part of the heart called the sinus node. Sinus tachycardia may be harmless, or it may be a sign of a serious condition. What are the causes? This condition may be caused by:  Exercise or exertion.  A fever.  Pain.  Loss of body fluids (dehydration).  Severe bleeding (hemorrhage).  Anxiety and stress.  Certain substances, including: ? Alcohol. ? Caffeine. ? Tobacco and nicotine products. ? Cold medicines. ? Illegal drugs.  Medical conditions including: ? Heart disease. ? An infection. ? An overactive thyroid (hyperthyroidism). ? A lack of red blood cells (anemia). What are the signs or symptoms? Symptoms of this condition include:  A feeling that the heart is beating quickly (palpitations).  Suddenly noticing your heartbeat (cardiac  awareness).  Dizziness.  Tiredness (fatigue).  Shortness of breath.  Chest pain.  Nausea.  Fainting. How is this diagnosed? This condition is diagnosed with:  A physical exam.  Other tests, such as: ? Blood tests. ? An electrocardiogram (ECG). This test measures the electrical activity of the heart. ? Ambulatory cardiac monitor. This records your heartbeats for 24 hours or more. You may be referred to a heart specialist (cardiologist). How is this treated? Treatment for this condition depends on the cause or the underlying condition. Treatment may involve:  Treating the underlying condition.  Taking new medicines or changing your current medicines as told by your health care provider.  Making changes to your diet or lifestyle. Follow these instructions at home: Lifestyle   Do not use any products that contain nicotine or tobacco, such as cigarettes and e-cigarettes. If you need help quitting, ask your health care provider.  Do not use illegal drugs, such as cocaine.  Learn relaxation methods to help you when you get stressed or anxious. These include deep breathing.  Avoid caffeine or other stimulants. Alcohol use   Do not drink alcohol if: ? Your health care provider tells you not to drink. ? You are pregnant, may be pregnant, or are planning to become pregnant.  If you drink alcohol, limit how much you have: ? 0-1 drink a day for women. ? 0-2 drinks a day for men.  Be aware of how much alcohol is in your drink. In the [place] one drink equals one typical bottle of beer (12 oz), one-half glass of wine (5 oz), or one shot of hard liquor (1 oz). General instructions  Drink enough fluids to keep your urine pale yellow.  Take over-the-counter and prescription medicines only as told by your health care provider.  Keep all follow-up visits as told by your health care provider. This is important. Contact a health care provider if you have:  A fever.  Vomiting  or diarrhea that does not go away. Get help right away if you:  Have pain in your chest, upper arms, jaw, or neck.  Become weak or dizzy.  Feel faint.  Have palpitations that do not go away. Summary  In sinus tachycardia, the heart beats more than 100 times a minute.  Sinus tachycardia may be harmless, or it may be a sign of a serious condition.  Treatment for this condition depends on the cause or the underlying condition.  Get help right away if you have pain in your chest, upper arms, jaw, or neck. This information is not intended to replace advice given to you by your health care provider. Make sure you discuss any questions you have with your health care provider. Document Revised: 04/24/2017 Document Reviewed: 04/24/2017 Elsevier Patient Education  2020 [employer]. You will be called by Dr. Lovena Neighbours nurse Victorino Dike) to arrange your Cat Scan

## 2019-10-26 ENCOUNTER — Encounter: Payer: Self-pay | Admitting: Cardiology

## 2019-10-26 ENCOUNTER — Telehealth: Payer: Self-pay | Admitting: Cardiology

## 2019-10-26 DIAGNOSIS — I248 Other forms of acute ischemic heart disease: Secondary | ICD-10-CM

## 2019-10-26 DIAGNOSIS — R000 Tachycardia, unspecified: Secondary | ICD-10-CM

## 2019-10-26 DIAGNOSIS — I472 Ventricular tachycardia: Secondary | ICD-10-CM

## 2019-10-26 NOTE — Telephone Encounter (Signed)
Returned call to pt.  Advised ok to give FMLA paperwork to our office for follow up.  Advised can provide work letter on Wednesday 10/28/19.  Pt will have wife pick up letter.

## 2019-10-26 NOTE — Telephone Encounter (Signed)
Yes, will sign FMLA for this past hospitalization and will put a letter in for him and I can sign it on Wednesday as well.Riccardo Dubin

## 2019-10-26 NOTE — Telephone Encounter (Signed)
New message   Pt has two questions. He would like to know if he could use Dr. Lalla Brothers on his FMLA papers since that is the doctor he has been working with? And if he could have a note to write him out of work for the rest of the week for his episode last week?

## 2019-10-28 ENCOUNTER — Telehealth: Payer: Self-pay | Admitting: Cardiology

## 2019-10-28 NOTE — Telephone Encounter (Signed)
Met with Pt today.  Gave return to work letter and received [employer].  Advised Pt that Dr. Quentin Ore would like Pt to have a stress test to evaluate for potential blockages.  Pt and wife indicate understanding.

## 2019-10-28 NOTE — Telephone Encounter (Signed)
FMLA paperwork received, completed, & was signed by Dr. Lalla Brothers.  Form was faxed to [facility] 10/28/19  KLM

## 2019-10-28 NOTE — Telephone Encounter (Signed)
FMLA paperwork faxed as requested.

## 2019-10-29 ENCOUNTER — Telehealth: Payer: Self-pay | Admitting: Cardiology

## 2019-10-29 NOTE — Telephone Encounter (Signed)
Patient calling stating he has his intermittent FMLA paperwork and would like to drop it off at the office for someone today.

## 2019-11-02 ENCOUNTER — Telehealth: Payer: Self-pay | Admitting: Cardiology

## 2019-11-02 NOTE — Telephone Encounter (Signed)
Patient calling for Xavier Johnston. He did not want me to take a message.

## 2019-11-02 NOTE — Telephone Encounter (Signed)
Returned call to Pt.  Calling to advise he is dropping off intermittent FMLA paperwork today.

## 2019-11-04 ENCOUNTER — Telehealth: Payer: Self-pay | Admitting: Cardiology

## 2019-11-04 NOTE — Telephone Encounter (Signed)
FMLA paperwork faxed as requested.  Paperwork given to Medical Records.

## 2019-11-04 NOTE — Telephone Encounter (Signed)
FMLA paperwork received, completed and signed by Dr. Lalla Brothers. Form was faxed to [facility]. 11/04/19 vlm

## 2019-11-13 ENCOUNTER — Other Ambulatory Visit (HOSPITAL_COMMUNITY)
Admission: RE | Admit: 2019-11-13 | Discharge: 2019-11-13 | Disposition: A | Discharge status: Home / Self Care | Payer: BC Managed Care – PPO | Source: Ambulatory Visit | Attending: Cardiology | Admitting: Cardiology

## 2019-11-13 DIAGNOSIS — Z01812 Encounter for preprocedural laboratory examination: Secondary | ICD-10-CM | POA: Insufficient documentation

## 2019-11-13 DIAGNOSIS — Z20822 Contact with and (suspected) exposure to covid-19: Secondary | ICD-10-CM | POA: Diagnosis not present

## 2019-11-13 LAB — SARS CORONAVIRUS 2 (TAT 6-24 HRS): SARS Coronavirus 2: NEGATIVE

## 2019-11-16 ENCOUNTER — Ambulatory Visit: Payer: BC Managed Care – PPO | Admitting: Cardiology

## 2019-11-17 ENCOUNTER — Other Ambulatory Visit: Payer: Self-pay

## 2019-11-17 ENCOUNTER — Other Ambulatory Visit: Payer: Self-pay | Admitting: Cardiology

## 2019-11-17 ENCOUNTER — Ambulatory Visit (INDEPENDENT_AMBULATORY_CARE_PROVIDER_SITE_OTHER): Payer: BC Managed Care – PPO

## 2019-11-17 DIAGNOSIS — I472 Ventricular tachycardia: Secondary | ICD-10-CM

## 2019-11-17 DIAGNOSIS — I248 Other forms of acute ischemic heart disease: Secondary | ICD-10-CM | POA: Diagnosis not present

## 2019-11-17 DIAGNOSIS — I4510 Unspecified right bundle-branch block: Secondary | ICD-10-CM

## 2019-11-17 DIAGNOSIS — R000 Tachycardia, unspecified: Secondary | ICD-10-CM

## 2019-11-17 LAB — EXERCISE TOLERANCE TEST
Estimated workload: 10.4 METS
Exercise duration (min): 8 min
Exercise duration (sec): 2 s
MPHR: 178 {beats}/min
Peak HR: 155 {beats}/min
Percent HR: 87 %
RPE: 20
Rest HR: 67 {beats}/min

## 2019-11-25 NOTE — Progress Notes (Signed)
Electrophysiology Office Note:    Date:  11/26/2019   ID:  [name], DOB 1975/07/26, MRN [id number]  PCP:  Maud Deed, PA  [facility] Cardiologist:  No primary care provider on file.  [facility] Electrophysiologist:  Lanier Prude, MD   Referring MD: Maud Deed, PA   Chief Complaint: Wide complex tachycardia  History of Present Illness:    Xavier Johnston is a 44 y.o. male with no past medical history presents as a follow up after recent hospitalization for an episode of wide complex tachycardia.   History reviewed. No pertinent past medical history.  Past Surgical History:  Procedure Laterality Date  . vastectomy      Current Medications: Current Meds  Medication Sig  . metoprolol succinate (TOPROL XL) 50 MG 24 hr tablet Take 1 tablet (50 mg total) by mouth daily. Take with or immediately following a meal.  . Multiple Vitamins-Minerals (ONE A DAY MENS VITACRAVES) CHEW Chew 2 tablets by mouth daily.  . naproxen (NAPROSYN) 250 MG tablet Take 500 mg by mouth as needed.      Allergies:   Patient has no known allergies.   Social History   Socioeconomic History  . Marital status: Married    Spouse name: Not on file  . Number of children: Not on file  . Years of education: Not on file  . Highest education level: Not on file  Occupational History  . Not on file  Tobacco Use  . Smoking status: Never Smoker  . Smokeless tobacco: Never Used  Vaping Use  . Vaping Use: Never used  Substance and Sexual Activity  . Alcohol use: No  . Drug use: No  . Sexual activity: Yes  Other Topics Concern  . Not on file  Social History Narrative  . Not on file   Social Determinants of Health   Financial Resource Strain:   . Difficulty of Paying Living Expenses: Not on file  Food Insecurity:   . Worried About Programme researcher, broadcasting/film/video in the Last Year: Not on file  . Ran Out of Food in the Last Year: Not on file  Transportation Needs:   . Lack of  Transportation (Medical): Not on file  . Lack of Transportation (Non-Medical): Not on file  Physical Activity:   . Days of Exercise per Week: Not on file  . Minutes of Exercise per Session: Not on file  Stress:   . Feeling of Stress : Not on file  Social Connections:   . Frequency of Communication with Friends and Family: Not on file  . Frequency of Social Gatherings with Friends and Family: Not on file  . Attends Religious Services: Not on file  . Active Member of Clubs or Organizations: Not on file  . Attends Banker Meetings: Not on file  . Marital Status: Not on file     Family History: The patient's family history includes Cancer in his paternal grandmother; Diabetes in his maternal grandmother; Heart attack in his father; Sudden Cardiac Death in his father.  ROS:   Please see the history of present illness.    All other systems reviewed and are negative.  EKGs/Labs/Other Studies Reviewed:    The following studies were reviewed today:  11/17/2019 Exercise Treadmill  Blood pressure demonstrated a normal response to exercise.  There was no ST segment deviation noted during stress.   Negative ETT Patient only achieved 87% PMHR Rare PVC;s in peak stress/recovery Normal hemodynamic response  10/23/2019 Echo 1. Left ventricular ejection fraction, by estimation, is 60 to 65%. The  left ventricle has normal function. The left ventricle has no regional  wall motion abnormalities. Left ventricular diastolic parameters were  normal.  2. Right ventricular systolic function is normal. The right ventricular  size is normal.  3. The mitral valve is normal in structure. No evidence of mitral valve  regurgitation. No evidence of mitral stenosis.  4. The aortic valve is tricuspid. Aortic valve regurgitation is not  visualized. No aortic stenosis is present.  5. Aortic dilatation noted. 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  6. The  inferior vena cava is normal in size with greater than 50%  respiratory variability, suggesting right atrial pressure of 3 mmHg.    10/22/2019 ECG: RBBB tachycardia at 220 bpm.     EKG:  The ekg ordered today demonstrates sinus rhythm with a first degree AV delay.  Recent Labs: 10/23/2019: BUN 16; Creatinine, Ser 1.16; Hemoglobin 13.8; Magnesium 2.3; Platelets 302; Potassium 3.6; Sodium 139; TSH 1.964  Recent Lipid Panel    Component Value Date/Time   CHOL 190 05/12/2014 1333   TRIG 93 05/12/2014 1333   HDL 37 (L) 05/12/2014 1333   CHOLHDL 5.1 05/12/2014 1333   VLDL 19 05/12/2014 1333   LDLCALC 134 (H) 05/12/2014 1333    Physical Exam:    VS:  BP 124/76   Pulse 65   Ht 6\' 1"  (1.854 m)   Wt 285 lb (129.3 kg)   SpO2 98%   BMI 37.60 kg/m     Wt Readings from Last 3 Encounters:  11/26/19 285 lb (129.3 kg)  10/23/19 288 lb 3.2 oz (130.7 kg)  12/20/15 267 lb (121.1 kg)     GEN:  Well nourished, well developed in no acute distress HEENT: Normal NECK: No JVD; No carotid bruits LYMPHATICS: No lymphadenopathy CARDIAC: RRR, no murmurs, rubs, gallops RESPIRATORY:  Clear to auscultation without rales, wheezing or rhonchi  ABDOMEN: Soft, non-tender, non-distended MUSCULOSKELETAL:  No edema; No deformity  SKIN: Warm and dry NEUROLOGIC:  Alert and oriented x 3 PSYCHIATRIC:  Normal affect   ASSESSMENT:    1. Wide-complex tachycardia (HCC)    PLAN:    In order of problems listed above:  1. Wide complex tachycardia ECG on 8/5 shows a typical right bundle branch block consistent with an SVT with aberrant conduction. No evidence of preexcitation on today's ECG. Intervals are normal. Disucssed management options with the patient today including EP study and ablation vs continued medical management. Given his desire to stop taking the metoprolol, the patient wants to pursue definitive management with EPS and ablation. I discussed the risks and benefits with the patient at length and  he is agreeable to proceed. I also discussed the possibility that we may bring for EP study and him not have an inducible arrhythmia.  Risk, benefits, and alternatives to EP study and radiofrequency ablation for SVT were also discussed in detail today. These risks include but are not limited to complete heart block, stroke, bleeding, vascular damage, tamponade, perforation, and death. The patient understands these risk and wishes to proceed.  We will therefore proceed with catheter ablation at the next available time.  Carto and ICE are requested for the procedure.    He will hold metoprolol 5 days prior to the procedure.  Medication Adjustments/Labs and Tests Ordered: Current medicines are reviewed at length with the patient today.  Concerns regarding medicines are outlined  above.  Orders Placed This Encounter  Procedures  . EKG 12-Lead   No orders of the defined types were placed in this encounter.   Patient Instructions  Medication Instructions:  Your physician recommends that you continue on your current medications as directed. Please refer to the Current Medication list given to you today.  *If you need a refill on your cardiac medications before your next appointment, please call your pharmacy*  Lab Work: None ordered.  If you have labs (blood work) drawn today and your tests are completely normal, you will receive your results only by: Marland Kitchen MyChart Message (if you have MyChart) OR . A paper copy in the mail If you have any lab test that is abnormal or we need to change your treatment, we will call you to review the results.  Testing/Procedures: Your physician has recommended that you have an ablation. Catheter ablation is a medical procedure used to treat some cardiac arrhythmias (irregular heartbeats). During catheter ablation, a long, thin, flexible tube is put into a blood vessel in your groin (upper thigh), or neck. This tube is called an ablation catheter. It is then guided to  your heart through the blood vessel. Radio frequency waves destroy small areas of heart tissue where abnormal heartbeats may cause an arrhythmia to start. Please see the instruction sheet given to you today.   Follow-Up: At [facility] you and your health needs are our priority.  As part of our continuing mission to provide you with exceptional heart care, we have created designated Provider Care Teams.  These Care Teams include your primary Cardiologist (physician) and Advanced Practice Providers (APPs -  Physician Assistants and Nurse Practitioners) who all work together to provide you with the care you need, when you need it.  We recommend signing up for the patient portal called "MyChart".  Sign up information is provided on this After Visit Summary.  MyChart is used to connect with patients for Virtual Visits (Telemedicine).  Patients are able to view lab/test results, encounter notes, upcoming appointments, etc.  Non-urgent messages can be sent to your provider as well.   To learn more about what you can do with MyChart, go to [email or web address].     Other Instructions:     Signed, Steffanie Dunn, MD, [facility]  11/26/2019 10:10 AM    Electrophysiology [facility]    Anticoagulation instructions: The patient is not on anticoagulation.  Medication instructions morning of: The patient should hold ALL medications the morning of the procedure . The patient should hold their metoprolol for 5 days prior to the procedure.  Discharge: Our plan will be to discharge the patient same day after a period of observation

## 2019-11-26 ENCOUNTER — Encounter: Payer: Self-pay | Admitting: *Deleted

## 2019-11-26 ENCOUNTER — Encounter: Payer: Self-pay | Admitting: Cardiology

## 2019-11-26 ENCOUNTER — Other Ambulatory Visit: Payer: Self-pay

## 2019-11-26 ENCOUNTER — Other Ambulatory Visit: Payer: BC Managed Care – PPO

## 2019-11-26 ENCOUNTER — Ambulatory Visit: Payer: BC Managed Care – PPO | Admitting: Cardiology

## 2019-11-26 VITALS — BP 124/76 | HR 65 | Ht 73.0 in | Wt 285.0 lb

## 2019-11-26 DIAGNOSIS — I472 Ventricular tachycardia: Secondary | ICD-10-CM | POA: Diagnosis not present

## 2019-11-26 DIAGNOSIS — R000 Tachycardia, unspecified: Secondary | ICD-10-CM

## 2019-11-26 NOTE — Patient Instructions (Addendum)
Medication Instructions:  Your physician recommends that you continue on your current medications as directed. Please refer to the Current Medication list given to you today.  *If you need a refill on your cardiac medications before your next appointment, please call your pharmacy*  Lab Work: None ordered.  If you have labs (blood work) drawn today and your tests are completely normal, you will receive your results only by: . MyChart Message (if you have MyChart) OR . A paper copy in the mail If you have any lab test that is abnormal or we need to change your treatment, we will call you to review the results.  Testing/Procedures: Your physician has recommended that you have an ablation. Catheter ablation is a medical procedure used to treat some cardiac arrhythmias (irregular heartbeats). During catheter ablation, a long, thin, flexible tube is put into a blood vessel in your groin (upper thigh), or neck. This tube is called an ablation catheter. It is then guided to your heart through the blood vessel. Radio frequency waves destroy small areas of heart tissue where abnormal heartbeats may cause an arrhythmia to start. Please see the instruction sheet given to you today.   Follow-Up: At [facility], you and your health needs are our priority.  As part of our continuing mission to provide you with exceptional heart care, we have created designated Provider Care Teams.  These Care Teams include your primary Cardiologist (physician) and Advanced Practice Providers (APPs -  Physician Assistants and Nurse Practitioners) who all work together to provide you with the care you need, when you need it.  We recommend signing up for the patient portal called "MyChart".  Sign up information is provided on this After Visit Summary.  MyChart is used to connect with patients for Virtual Visits (Telemedicine).  Patients are able to view lab/test results, encounter notes, upcoming appointments, etc.  Non-urgent  messages can be sent to your provider as well.   To learn more about what you can do with MyChart, go to [email or web address].     Other Instructions:  

## 2019-12-14 ENCOUNTER — Other Ambulatory Visit: Payer: Self-pay

## 2019-12-14 MED ORDER — METOPROLOL SUCCINATE ER 50 MG PO TB24
50.0000 mg | ORAL_TABLET | Freq: Every day | ORAL | 3 refills | Status: DC
Start: 2019-12-14 — End: 2020-01-12

## 2019-12-14 NOTE — Telephone Encounter (Signed)
Pt's medication was sent to pt's pharmacy as requested. Confirmation received.  °

## 2019-12-23 ENCOUNTER — Other Ambulatory Visit: Payer: Self-pay

## 2019-12-23 ENCOUNTER — Other Ambulatory Visit: Payer: BC Managed Care – PPO | Admitting: *Deleted

## 2019-12-23 LAB — CBC WITH DIFFERENTIAL/PLATELET
Basophils Absolute: 0 10*3/uL (ref 0.0–0.2)
Basos: 1 %
EOS (ABSOLUTE): 0.2 10*3/uL (ref 0.0–0.4)
Eos: 3 %
Hematocrit: 39.1 % (ref 37.5–51.0)
Hemoglobin: 13 g/dL (ref 13.0–17.7)
Immature Grans (Abs): 0 10*3/uL (ref 0.0–0.1)
Immature Granulocytes: 0 %
Lymphocytes Absolute: 1.8 10*3/uL (ref 0.7–3.1)
Lymphs: 31 %
MCH: 27.7 pg (ref 26.6–33.0)
MCHC: 33.2 g/dL (ref 31.5–35.7)
MCV: 83 fL (ref 79–97)
Monocytes Absolute: 0.6 10*3/uL (ref 0.1–0.9)
Monocytes: 9 %
Neutrophils Absolute: 3.4 10*3/uL (ref 1.4–7.0)
Neutrophils: 56 %
Platelets: 252 10*3/uL (ref 150–450)
RBC: 4.69 x10E6/uL (ref 4.14–5.80)
RDW: 14.4 % (ref 11.6–15.4)
WBC: 6 10*3/uL (ref 3.4–10.8)

## 2019-12-23 LAB — BASIC METABOLIC PANEL
BUN/Creatinine Ratio: 11 (ref 9–20)
BUN: 10 mg/dL (ref 6–24)
CO2: 26 mmol/L (ref 20–29)
Calcium: 9.5 mg/dL (ref 8.7–10.2)
Chloride: 102 mmol/L (ref 96–106)
Creatinine, Ser: 0.89 mg/dL (ref 0.76–1.27)
GFR calc Af Amer: 122 mL/min/{1.73_m2} (ref >59)
GFR calc non Af Amer: 105 mL/min/{1.73_m2} (ref >59)
Glucose: 120 mg/dL — ABNORMAL HIGH (ref 65–99)
Potassium: 4.3 mmol/L (ref 3.5–5.2)
Sodium: 140 mmol/L (ref 134–144)

## 2020-01-06 ENCOUNTER — Other Ambulatory Visit (HOSPITAL_COMMUNITY)
Admission: RE | Admit: 2020-01-06 | Discharge: 2020-01-06 | Disposition: A | Discharge status: Home / Self Care | Payer: BC Managed Care – PPO | Source: Ambulatory Visit | Attending: Cardiology | Admitting: Cardiology

## 2020-01-06 DIAGNOSIS — Z20822 Contact with and (suspected) exposure to covid-19: Secondary | ICD-10-CM | POA: Insufficient documentation

## 2020-01-06 DIAGNOSIS — Z01812 Encounter for preprocedural laboratory examination: Secondary | ICD-10-CM | POA: Insufficient documentation

## 2020-01-06 LAB — SARS CORONAVIRUS 2 (TAT 6-24 HRS): SARS Coronavirus 2: NEGATIVE

## 2020-01-07 NOTE — Progress Notes (Signed)
Attempted to call patient regarding procedure instructions for tomorrow.  No answer. 

## 2020-01-08 ENCOUNTER — Ambulatory Visit (HOSPITAL_COMMUNITY): Payer: BC Managed Care – PPO | Admitting: Anesthesiology

## 2020-01-08 ENCOUNTER — Other Ambulatory Visit: Payer: Self-pay

## 2020-01-08 ENCOUNTER — Inpatient Hospital Stay (HOSPITAL_COMMUNITY)
Admission: RE | Admit: 2020-01-08 | Discharge: 2020-01-12 | DRG: 274 | Disposition: A | Discharge status: Home / Self Care | Payer: BC Managed Care – PPO | Attending: Cardiology | Admitting: Cardiology

## 2020-01-08 ENCOUNTER — Encounter (HOSPITAL_COMMUNITY): Payer: Self-pay | Admitting: Cardiology

## 2020-01-08 ENCOUNTER — Encounter (HOSPITAL_COMMUNITY)
Admission: RE | Disposition: A | Discharge status: Home / Self Care | Payer: Self-pay | Source: Home / Self Care | Attending: Cardiology

## 2020-01-08 DIAGNOSIS — Z6837 Body mass index (BMI) 37.0-37.9, adult: Secondary | ICD-10-CM

## 2020-01-08 DIAGNOSIS — Z8249 Family history of ischemic heart disease and other diseases of the circulatory system: Secondary | ICD-10-CM

## 2020-01-08 DIAGNOSIS — E785 Hyperlipidemia, unspecified: Secondary | ICD-10-CM | POA: Diagnosis present

## 2020-01-08 DIAGNOSIS — Z20822 Contact with and (suspected) exposure to covid-19: Secondary | ICD-10-CM | POA: Diagnosis not present

## 2020-01-08 DIAGNOSIS — I2489 Other forms of acute ischemic heart disease: Secondary | ICD-10-CM | POA: Diagnosis present

## 2020-01-08 DIAGNOSIS — R000 Tachycardia, unspecified: Secondary | ICD-10-CM

## 2020-01-08 DIAGNOSIS — I472 Ventricular tachycardia, unspecified: Secondary | ICD-10-CM

## 2020-01-08 DIAGNOSIS — I4510 Unspecified right bundle-branch block: Secondary | ICD-10-CM | POA: Diagnosis present

## 2020-01-08 DIAGNOSIS — E669 Obesity, unspecified: Secondary | ICD-10-CM | POA: Diagnosis not present

## 2020-01-08 DIAGNOSIS — I248 Other forms of acute ischemic heart disease: Secondary | ICD-10-CM | POA: Diagnosis present

## 2020-01-08 DIAGNOSIS — Z8241 Family history of sudden cardiac death: Secondary | ICD-10-CM

## 2020-01-08 DIAGNOSIS — Z833 Family history of diabetes mellitus: Secondary | ICD-10-CM | POA: Diagnosis not present

## 2020-01-08 HISTORY — PX: SVT ABLATION: EP1225

## 2020-01-08 LAB — HIV ANTIBODY (ROUTINE TESTING W REFLEX): HIV Screen 4th Generation wRfx: NONREACTIVE

## 2020-01-08 SURGERY — SVT ABLATION
Anesthesia: General

## 2020-01-08 MED ORDER — PHENYLEPHRINE 40 MCG/ML (10ML) SYRINGE FOR IV PUSH (FOR BLOOD PRESSURE SUPPORT)
PREFILLED_SYRINGE | INTRAVENOUS | Status: DC | PRN
  Administered 2020-01-08 (×2): 80 ug via INTRAVENOUS

## 2020-01-08 MED ORDER — FENTANYL CITRATE (PF) 250 MCG/5ML IJ SOLN
INTRAMUSCULAR | Status: DC | PRN
Start: 2020-01-08 — End: 2020-01-11
  Administered 2020-01-08: 100 ug via INTRAVENOUS

## 2020-01-08 MED ORDER — ACETAMINOPHEN 325 MG PO TABS: 650.0000 mg | ORAL_TABLET | Freq: Four times a day (QID) | ORAL | Status: DC | PRN

## 2020-01-08 MED ORDER — ADULT MULTIVITAMIN W/MINERALS CH
1.0000 | ORAL_TABLET | Freq: Every day | ORAL | Status: DC
  Administered 2020-01-09 – 2020-01-12 (×4): 1 via ORAL
  Filled 2020-01-08 (×4): qty 1

## 2020-01-08 MED ORDER — PHENOL 1.4 % MT LIQD
1.0000 | OROMUCOSAL | Status: DC | PRN
  Filled 2020-01-08: qty 177

## 2020-01-08 MED ORDER — ISOPROTERENOL HCL 0.2 MG/ML IJ SOLN
INTRAMUSCULAR | Status: AC
  Filled 2020-01-08: qty 5

## 2020-01-08 MED ORDER — SODIUM CHLORIDE 0.9% FLUSH: 3.0000 mL | Freq: Two times a day (BID) | INTRAVENOUS | Status: DC

## 2020-01-08 MED ORDER — ONDANSETRON HCL 4 MG/2ML IJ SOLN: 4.0000 mg | Freq: Four times a day (QID) | INTRAMUSCULAR | Status: DC | PRN

## 2020-01-08 MED ORDER — SUCCINYLCHOLINE CHLORIDE 200 MG/10ML IV SOSY
PREFILLED_SYRINGE | INTRAVENOUS | Status: DC | PRN
  Administered 2020-01-08: 140 mg via INTRAVENOUS

## 2020-01-08 MED ORDER — ONDANSETRON HCL 4 MG/2ML IJ SOLN
INTRAMUSCULAR | Status: DC | PRN
  Administered 2020-01-08: 4 mg via INTRAVENOUS

## 2020-01-08 MED ORDER — PROPOFOL 10 MG/ML IV BOLUS
INTRAVENOUS | Status: DC | PRN
  Administered 2020-01-08: 200 mg via INTRAVENOUS
  Administered 2020-01-08 (×2): 50 mg via INTRAVENOUS

## 2020-01-08 MED ORDER — LIDOCAINE 2% (20 MG/ML) 5 ML SYRINGE
INTRAMUSCULAR | Status: DC | PRN
  Administered 2020-01-08: 100 mg via INTRAVENOUS

## 2020-01-08 MED ORDER — SENNA 8.6 MG PO TABS
1.0000 | ORAL_TABLET | Freq: Two times a day (BID) | ORAL | Status: DC
  Administered 2020-01-09 – 2020-01-12 (×6): 8.6 mg via ORAL
  Filled 2020-01-08 (×8): qty 1

## 2020-01-08 MED ORDER — BUPIVACAINE HCL (PF) 0.25 % IJ SOLN
INTRAMUSCULAR | Status: DC | PRN
  Administered 2020-01-08: 30 mL

## 2020-01-08 MED ORDER — SODIUM CHLORIDE 0.9% FLUSH
3.0000 mL | Freq: Two times a day (BID) | INTRAVENOUS | Status: DC
  Administered 2020-01-08 – 2020-01-12 (×6): 3 mL via INTRAVENOUS

## 2020-01-08 MED ORDER — BUPIVACAINE HCL (PF) 0.25 % IJ SOLN
INTRAMUSCULAR | Status: AC
  Filled 2020-01-08: qty 30

## 2020-01-08 MED ORDER — DOCUSATE SODIUM 100 MG PO CAPS
100.0000 mg | ORAL_CAPSULE | Freq: Two times a day (BID) | ORAL | Status: DC
  Administered 2020-01-08 – 2020-01-12 (×8): 100 mg via ORAL
  Filled 2020-01-08 (×8): qty 1

## 2020-01-08 MED ORDER — HEPARIN (PORCINE) IN NACL 1000-0.9 UT/500ML-% IV SOLN
INTRAVENOUS | Status: AC
  Filled 2020-01-08: qty 500

## 2020-01-08 MED ORDER — ACETAMINOPHEN 325 MG PO TABS: 650.0000 mg | ORAL_TABLET | ORAL | Status: DC | PRN

## 2020-01-08 MED ORDER — SODIUM CHLORIDE 0.9 % IV SOLN: INTRAVENOUS | Status: DC

## 2020-01-08 MED ORDER — METOPROLOL SUCCINATE ER 50 MG PO TB24
50.0000 mg | ORAL_TABLET | Freq: Every day | ORAL | Status: DC
  Administered 2020-01-08 – 2020-01-11 (×4): 50 mg via ORAL
  Filled 2020-01-08 (×4): qty 1

## 2020-01-08 MED ORDER — DEXAMETHASONE SODIUM PHOSPHATE 10 MG/ML IJ SOLN
INTRAMUSCULAR | Status: DC | PRN
  Administered 2020-01-08: 4 mg via INTRAVENOUS

## 2020-01-08 MED ORDER — HEPARIN (PORCINE) IN NACL 1000-0.9 UT/500ML-% IV SOLN
INTRAVENOUS | Status: DC | PRN
  Administered 2020-01-08 (×2): 500 mL

## 2020-01-08 MED ORDER — ACETAMINOPHEN 650 MG RE SUPP: 650.0000 mg | Freq: Four times a day (QID) | RECTAL | Status: DC | PRN

## 2020-01-08 MED ORDER — ISOPROTERENOL HCL 0.2 MG/ML IJ SOLN
INTRAVENOUS | Status: DC | PRN
  Administered 2020-01-08: 4 ug/min via INTRAVENOUS

## 2020-01-08 MED ORDER — MIDAZOLAM HCL 2 MG/2ML IJ SOLN
INTRAMUSCULAR | Status: DC | PRN
  Administered 2020-01-08: 2 mg via INTRAVENOUS

## 2020-01-08 MED ORDER — NAPROXEN 250 MG PO TABS
250.0000 mg | ORAL_TABLET | Freq: Two times a day (BID) | ORAL | Status: DC | PRN
  Filled 2020-01-08: qty 2

## 2020-01-08 MED ORDER — PROPOFOL 500 MG/50ML IV EMUL
INTRAVENOUS | Status: DC | PRN
  Administered 2020-01-08: 125 ug/kg/min via INTRAVENOUS

## 2020-01-08 MED ORDER — SODIUM CHLORIDE 0.9% FLUSH: 3.0000 mL | INTRAVENOUS | Status: DC | PRN

## 2020-01-08 MED ORDER — SODIUM CHLORIDE 0.9 % IV SOLN: 250.0000 mL | INTRAVENOUS | Status: DC | PRN

## 2020-01-08 SURGICAL SUPPLY — 13 items
CATH CRD2 QUAD 6FR 5 (CATHETERS) ×1 IMPLANT
CATH DECANAV F CURVE (CATHETERS) ×1 IMPLANT
CATH EZ STEER NAV 4MM F-J CUR (ABLATOR) ×1 IMPLANT
CATH JOSEPH QUAD ALLRED 6F REP (CATHETERS) ×1 IMPLANT
CATH QUAD COURNAND 5FR REPROC (CATHETERS) ×1 IMPLANT
CLOSURE PERCLOSE PROSTYLE (VASCULAR PRODUCTS) ×3 IMPLANT
PACK EP LATEX FREE (CUSTOM PROCEDURE TRAY) ×2
PACK EP LF (CUSTOM PROCEDURE TRAY) ×1 IMPLANT
PAD PRO RADIOLUCENT 2001M-C (PAD) ×2 IMPLANT
PATCH CARTO3 (PAD) ×1 IMPLANT
SHEATH PINNACLE 7F 10CM (SHEATH) ×1 IMPLANT
SHEATH PINNACLE 8F 10CM (SHEATH) ×2 IMPLANT
SHEATH PROBE COVER 6X72 (BAG) ×1 IMPLANT

## 2020-01-08 NOTE — Progress Notes (Signed)
   01/08/20 1700  Assess: MEWS Score  BP 109/73  Pulse Rate (!) 116  ECG Heart Rate (!) 117  SpO2 99 %  Assess: MEWS Score  MEWS Temp 0  MEWS Systolic 0  MEWS Pulse 2  MEWS RR 0  MEWS LOC 0  MEWS Score 2  MEWS Score Color Yellow  Assess: if the MEWS score is Yellow or Red  Were vital signs taken at a resting state? Yes  Focused Assessment No change from prior assessment  Early Detection of Sepsis Score *See Row Information* Low  MEWS guidelines implemented *See Row Information* Yes (admitted with tachycardia as diagnosis )  Treat  MEWS Interventions Other (Comment) (discussed with charge nurse)  Pain Scale 0-10  Pain Score 0  Take Vital Signs  Increase Vital Sign Frequency  Yellow: Q 2hr X 2 then Q 4hr X 2, if remains yellow, continue Q 4hrs  Notify: Charge Nurse/RN  Name of Charge Nurse/RN Notified christy johnson  Date Charge Nurse/RN Notified 01/08/20  Time Charge Nurse/RN Notified 1700  Document  Patient Outcome Other (Comment) (stable without intervention )  Progress note created (see row info) Yes  Patient admitted with tachycardia and no change in rhythm, implemented frequent vital signs and patient monitored closely.  Did not notify physician, rapid response or respiratory therapy. Patient resting comfortably with no distress. Lungs clear no shortness of breath or chest pain, no palpitations, lightheadedness or dizziness.

## 2020-01-08 NOTE — H&P (Addendum)
Electrophysiology Office Note:    Date:  01/08/2020  ID:  Xavier Johnston, DOB Aug 11, 1975, MRN [id number]  PCP:  Maud Deed, PA            [facility] Cardiologist:  No primary care provider on file.  [facility] Electrophysiologist:  Lanier Prude, MD   Referring MD: Maud Deed, PA   Chief Complaint: Wide complex tachycardia  History of Present Illness:    Xavier Johnston is a 44 y.o. male with no past medical history presents as a follow up after recent hospitalization for an episode of wide complex tachycardia.   History reviewed. No pertinent past medical history.       Past Surgical History:  Procedure Laterality Date  . vastectomy      Current Medications: Active Medications      Current Meds  Medication Sig  . metoprolol succinate (TOPROL XL) 50 MG 24 hr tablet Take 1 tablet (50 mg total) by mouth daily. Take with or immediately following a meal.  . Multiple Vitamins-Minerals (ONE A DAY MENS VITACRAVES) CHEW Chew 2 tablets by mouth daily.  . naproxen (NAPROSYN) 250 MG tablet Take 500 mg by mouth as needed.        Allergies:   Patient has no known allergies.   Social History        Socioeconomic History  . Marital status: Married    Spouse name: Not on file  . Number of children: Not on file  . Years of education: Not on file  . Highest education level: Not on file  Occupational History  . Not on file  Tobacco Use  . Smoking status: Never Smoker  . Smokeless tobacco: Never Used  Vaping Use  . Vaping Use: Never used  Substance and Sexual Activity  . Alcohol use: No  . Drug use: No  . Sexual activity: Yes  Other Topics Concern  . Not on file  Social History Narrative  . Not on file   Social Determinants of Health      Financial Resource Strain:   . Difficulty of Paying Living Expenses: Not on file  Food Insecurity:   . Worried About Programme researcher, broadcasting/film/video in the Last Year: Not on file  . Ran Out of Food  in the Last Year: Not on file  Transportation Needs:   . Lack of Transportation (Medical): Not on file  . Lack of Transportation (Non-Medical): Not on file  Physical Activity:   . Days of Exercise per Week: Not on file  . Minutes of Exercise per Session: Not on file  Stress:   . Feeling of Stress : Not on file  Social Connections:   . Frequency of Communication with Friends and Family: Not on file  . Frequency of Social Gatherings with Friends and Family: Not on file  . Attends Religious Services: Not on file  . Active Member of Clubs or Organizations: Not on file  . Attends Banker Meetings: Not on file  . Marital Status: Not on file     Family History: The patient's family history includes Cancer in his paternal grandmother; Diabetes in his maternal grandmother; Heart attack in his father; Sudden Cardiac Death in his father.  ROS:   Please see the history of present illness.    All other systems reviewed and are negative.  EKGs/Labs/Other Studies Reviewed:    The following studies were reviewed today:  11/17/2019 Exercise Treadmill  Blood pressure demonstrated a normal response to  exercise.  There was no ST segment deviation noted during stress.  Negative ETT Patient only achieved 87% PMHR Rare PVC;s in peak stress/recovery Normal hemodynamic response    10/23/2019 Echo 1. Left ventricular ejection fraction, by estimation, is 60 to 65%. The  left ventricle has normal function. The left ventricle has no regional  wall motion abnormalities. Left ventricular diastolic parameters were  normal.  2. Right ventricular systolic function is normal. The right ventricular  size is normal.  3. The mitral valve is normal in structure. No evidence of mitral valve  regurgitation. No evidence of mitral stenosis.  4. The aortic valve is tricuspid. Aortic valve regurgitation is not  visualized. No aortic stenosis is present.  5. Aortic dilatation noted. There  is mild dilatation of the aortic root  measuring 38 mm.  6. The inferior vena cava is normal in size with greater than 50%  respiratory variability, suggesting right atrial pressure of 3 mmHg.    10/22/2019 ECG: RBBB tachycardia at 220 bpm.     EKG:  The ekg ordered today demonstrates sinus rhythm with a first degree AV delay.  Recent Labs: 10/23/2019: BUN 16; Creatinine, Ser 1.16; Hemoglobin 13.8; Magnesium 2.3; Platelets 302; Potassium 3.6; Sodium 139; TSH 1.964  Recent Lipid Panel Labs (Brief)          Component Value Date/Time   CHOL 190 05/12/2014 1333   TRIG 93 05/12/2014 1333   HDL 37 (L) 05/12/2014 1333   CHOLHDL 5.1 05/12/2014 1333   VLDL 19 05/12/2014 1333   LDLCALC 134 (H) 05/12/2014 1333      Physical Exam:    VS:  BP 124/76   Pulse 65   Ht 6\' 1"  (1.854 m)   Wt 285 lb (129.3 kg)   SpO2 98%   BMI 37.60 kg/m        Wt Readings from Last 3 Encounters:  11/26/19 285 lb (129.3 kg)  10/23/19 288 lb 3.2 oz (130.7 kg)  12/20/15 267 lb (121.1 kg)     GEN:  Well nourished, well developed in no acute distress HEENT: Normal NECK: No JVD; No carotid bruits LYMPHATICS: No lymphadenopathy CARDIAC: RRR, no murmurs, rubs, gallops RESPIRATORY:  Clear to auscultation without rales, wheezing or rhonchi  ABDOMEN: Soft, non-tender, non-distended MUSCULOSKELETAL:  No edema; No deformity  SKIN: Warm and dry NEUROLOGIC:  Alert and oriented x 3 PSYCHIATRIC:  Normal affect   ASSESSMENT:    1. Wide-complex tachycardia (HCC)    PLAN:    In order of problems listed above:  1. Wide complex tachycardia ECG on 8/5 shows a typical right bundle branch block consistent with an SVT with aberrant conduction. No evidence of preexcitation on today's ECG. Intervals are normal. Disucssed management options with the patient today including EP study and ablation vs continued medical management. Given his desire to stop taking the metoprolol, the patient wants to  pursue definitive management with EPS and ablation. I discussed the risks and benefits with the patient at length and he is agreeable to proceed. I also discussed the possibility that we may bring for EP study and him not have an inducible arrhythmia.  Risk, benefits, and alternatives to EP study and radiofrequency ablation for SVT were also discussed in detail today. These risks include but are not limited to complete heart block, stroke, bleeding, vascular damage, tamponade, perforation, and death. The patient understands these risk and wishes to proceed.  We will therefore proceed with catheter ablation at the next available time.  Carto and ICE are requested for the procedure.    --------------------------------------------------------------- I have seen, examined the patient, and reviewed the above assessment and plan.    Proceed with EP study.  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  The rate and morphology of this ventricular tachycardia matches his twelve-lead EKG from prior to admission.  EP study also showed an inducible ventricular flutter which is not clinically relevant.  I am very concerned that the patient has a inducible sustained monomorphic ventricular tachycardia in an otherwise structurally normal heart by 2D echo.  I would like to admit him to get a cardiac MRI and left heart catheterization.  I want to be able to assess for diagnoses such as ARVC, sarcoid or other infiltrative cardiomyopathies.  I want to rule out any coronary disease or anomalous coronaries during the left heart catheterization.    Depending on results of the cardiac MRI and left heart catheterization, will need to discuss the utility of an  implantable defibrillator.  I discussed the results of the EP study today at length with his wife who is very understanding of the plan.  Sheria Lang T. Lalla Brothers, MD, [facility] Cardiac Electrophysiology

## 2020-01-08 NOTE — Progress Notes (Signed)
Patients right groin site checked q 15-30 minutes during bedrest with no change in assessment, remains level 0. Report to Sander Radon RN

## 2020-01-08 NOTE — Anesthesia Preprocedure Evaluation (Addendum)
Anesthesia Evaluation  Patient identified by MRN, date of birth, ID band Patient awake    Reviewed: Allergy & Precautions, NPO status , Patient's Chart, lab work & pertinent test results  History of Anesthesia Complications Negative for: history of anesthetic complications  Airway Mallampati: III  TM Distance: >3 FB Neck ROM: Full    Dental no notable dental hx. (+) Dental Advisory Given   Pulmonary neg pulmonary ROS,    Pulmonary exam normal        Cardiovascular Normal cardiovascular exam+ dysrhythmias Atrial Fibrillation   IMPRESSIONS    1. Left ventricular ejection fraction, by estimation, is 60 to 65%. The  left ventricle has normal function. The left ventricle has no regional  wall motion abnormalities. Left ventricular diastolic parameters were  normal.  2. Right ventricular systolic function is normal. The right ventricular  size is normal.  3. The mitral valve is normal in structure. No evidence of mitral valve  regurgitation. No evidence of mitral stenosis.  4. The aortic valve is tricuspid. Aortic valve regurgitation is not  visualized. No aortic stenosis is present.  5. Aortic dilatation noted. 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  6. The inferior vena cava is normal in size with greater than 50%  respiratory variability, suggesting right atrial pressure of 3 mmHg.  Study Highlights    Blood pressure demonstrated a normal response to exercise.  There was no ST segment deviation noted during stress.   Negative ETT Patient only achieved 87% PMHR Rare PVC;s in peak stress/recovery Normal hemodynamic response     Neuro/Psych negative neurological ROS     GI/Hepatic negative GI ROS, Neg liver ROS,   Endo/Other  negative endocrine ROS  Renal/GU negative Renal ROS     Musculoskeletal negative musculoskeletal ROS (+)   Abdominal   Peds  Hematology negative  hematology ROS (+)   Anesthesia Other Findings   Reproductive/Obstetrics                            Anesthesia Physical Anesthesia Plan  ASA: II  Anesthesia Plan: General   Post-op Pain Management:    Induction: Intravenous  PONV Risk Score and Plan: 2 and Ondansetron and Dexamethasone  Airway Management Planned: Oral ETT and LMA  Additional Equipment:   Intra-op Plan:   Post-operative Plan: Extubation in OR  Informed Consent: I have reviewed the patients History and Physical, chart, labs and discussed the procedure including the risks, benefits and alternatives for the proposed anesthesia with the patient or authorized representative who has indicated his/her understanding and acceptance.     Dental advisory given  Plan Discussed with: Anesthesiologist and CRNA  Anesthesia Plan Comments:       Anesthesia Quick Evaluation

## 2020-01-08 NOTE — Anesthesia Procedure Notes (Signed)
Procedure Name: Intubation Date/Time: 01/08/2020 11:30 AM Performed by: Adria Dill, CRNA Pre-anesthesia Checklist: Patient identified, Emergency Drugs available, Suction available and Patient being monitored Patient Re-evaluated:Patient Re-evaluated prior to induction Oxygen Delivery Method: Circle system utilized Preoxygenation: Pre-oxygenation with 100% oxygen Induction Type: IV induction Ventilation: Mask ventilation without difficulty Laryngoscope Size: Miller and 3 Grade View: Grade I Tube type: Oral Tube size: 7.5 mm Number of attempts: 1 Airway Equipment and Method: Stylet and Oral airway Placement Confirmation: ETT inserted through vocal cords under direct vision,  positive ETCO2 and breath sounds checked- equal and bilateral Secured at: 22 cm Tube secured with: Tape Dental Injury: Teeth and Oropharynx as per pre-operative assessment

## 2020-01-09 DIAGNOSIS — I472 Ventricular tachycardia: Secondary | ICD-10-CM | POA: Diagnosis not present

## 2020-01-09 LAB — BASIC METABOLIC PANEL
Anion gap: 8 (ref 5–15)
BUN: 11 mg/dL (ref 6–20)
CO2: 26 mmol/L (ref 22–32)
Calcium: 9.4 mg/dL (ref 8.9–10.3)
Chloride: 106 mmol/L (ref 98–111)
Creatinine, Ser: 0.93 mg/dL (ref 0.61–1.24)
GFR, Estimated: >60 mL/min (ref >60)
Glucose, Bld: 98 mg/dL (ref 70–99)
Potassium: 4 mmol/L (ref 3.5–5.1)
Sodium: 140 mmol/L (ref 135–145)

## 2020-01-09 LAB — CBC
HCT: 38.7 % — ABNORMAL LOW (ref 39.0–52.0)
Hemoglobin: 12.6 g/dL — ABNORMAL LOW (ref 13.0–17.0)
MCH: 27 pg (ref 26.0–34.0)
MCHC: 32.6 g/dL (ref 30.0–36.0)
MCV: 83 fL (ref 80.0–100.0)
Platelets: 265 10*3/uL (ref 150–400)
RBC: 4.66 MIL/uL (ref 4.22–5.81)
RDW: 13.9 % (ref 11.5–15.5)
WBC: 10.2 10*3/uL (ref 4.0–10.5)
nRBC: 0 % (ref 0.0–0.2)

## 2020-01-09 MED ORDER — SODIUM CHLORIDE 0.9% FLUSH: 3.0000 mL | Freq: Two times a day (BID) | INTRAVENOUS | Status: DC

## 2020-01-09 NOTE — Plan of Care (Signed)
Patient remains in SR ST with first degree block no ventricular ectopy this shift.

## 2020-01-09 NOTE — Progress Notes (Signed)
   Progress Note   Subjective   Doing well today, the patient denies CP or SOB.  No new concerns  Inpatient Medications    Scheduled Meds: . docusate sodium  100 mg Oral BID  . metoprolol succinate  50 mg Oral Q supper  . multivitamin with minerals  1 tablet Oral Q breakfast  . senna  1 tablet Oral BID  . sodium chloride flush  3 mL Intravenous Q12H  . sodium chloride flush  3 mL Intravenous Q12H   Continuous Infusions: . sodium chloride     PRN Meds: sodium chloride, acetaminophen **OR** acetaminophen, acetaminophen, naproxen, ondansetron (ZOFRAN) IV, phenol, sodium chloride flush   Vital Signs    Vitals:   01/08/20 2345 01/09/20 0428 01/09/20 0515 01/09/20 0720  BP: 117/80   116/65  Pulse:    72  Resp: 18 18  20   Temp: 98.4 F (36.9 C) 98.7 F (37.1 C)  98.7 F (37.1 C)  TempSrc: Oral Oral  Oral  SpO2:    98%  Weight:   126.7 kg   Height:        Intake/Output Summary (Last 24 hours) at 01/09/2020 0831 Last data filed at 01/08/2020 1400 Gross per 24 hour  Intake 1000 ml  Output 25 ml  Net 975 ml   Filed Weights   01/08/20 0824 01/09/20 0515  Weight: 129.3 kg 126.7 kg    Telemetry    sinus - Personally Reviewed  Physical Exam   GEN- The patient is well appearing, sleeping but rouses Head- normocephalic, atraumatic Eyes-  Sclera clear, conjunctiva pink Ears- hearing intact Oropharynx- clear Neck- supple, Lungs-  normal work of breathing Heart- Regular rate and rhythm  GI- soft  Extremities- no clubbing, cyanosis, or edema  MS- no significant deformity or atrophy Skin- no rash or lesion Psych- euthymic mood, full affect Neuro- strength and sensation are intact   Labs    Chemistry Recent Labs  Lab 01/09/20 0219  NA 140  K 4.0  CL 106  CO2 26  GLUCOSE 98  BUN 11  CREATININE 0.93  CALCIUM 9.4  GFRNONAA >60  ANIONGAP 8     Hematology Recent Labs  Lab 01/09/20 0219  WBC 10.2  RBC 4.66  HGB 12.6*  HCT 38.7*  MCV 83.0  MCH  27.0  MCHC 32.6  RDW 13.9  PLT 265     Patient ID  Xavier L Purcellis a 44 y.o.malewith no past medical history presents as a follow up after recent hospitalization for an episode of wide complex tachycardia.  He was found to have inducible VT on EPS and is admitted for cath and cardiac MRI on Monday.  Assessment & Plan    1.  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  Echo from august is reviewed and reveals preserved EF. Will obtain cardiac MRI to evaluate for abnormal cardiac substrate in the LV (given likely left ventricular tachycardia described on EPS).  Cath is also planned. If he has no structural heart disease, then I would advise beta blocker therapy, without ICD going forward.  If he has structural heart disease of irreversible nature on MRI, then perhaps ICD could be considered at that point depending on its severity.  EP to follow over the weekend.  September MD, [facility] 01/09/2020 8:31 AM

## 2020-01-09 NOTE — Plan of Care (Signed)
  Problem: Education: Goal: Understanding of disease, treatment, and recovery process will improve Outcome: Progressing   Problem: Activity: Goal: Ability to return to baseline activity level will improve Outcome: Progressing   Problem: Health Behavior/ Discharge Planning: Goal: Ability to safely manage health related needs after discharge Outcome: Progressing

## 2020-01-10 DIAGNOSIS — I472 Ventricular tachycardia: Secondary | ICD-10-CM | POA: Diagnosis not present

## 2020-01-10 MED ORDER — SODIUM CHLORIDE 0.9 % IV SOLN: 250.0000 mL | INTRAVENOUS | Status: DC | PRN

## 2020-01-10 MED ORDER — SODIUM CHLORIDE 0.9% FLUSH: 3.0000 mL | INTRAVENOUS | Status: DC | PRN

## 2020-01-10 MED ORDER — SODIUM CHLORIDE 0.9 % WEIGHT BASED INFUSION
3.0000 mL/kg/h | INTRAVENOUS | Status: DC
  Administered 2020-01-11: 3 mL/kg/h via INTRAVENOUS

## 2020-01-10 MED ORDER — SODIUM CHLORIDE 0.9 % WEIGHT BASED INFUSION
1.0000 mL/kg/h | INTRAVENOUS | Status: DC
  Administered 2020-01-11 (×2): 1 mL/kg/h via INTRAVENOUS

## 2020-01-10 NOTE — H&P (View-Only) (Signed)
 Progress Note   Subjective   Doing well today, the patient denies CP or SOB.  No new concerns  Inpatient Medications    Scheduled Meds: . docusate sodium  100 mg Oral BID  . metoprolol succinate  50 mg Oral Q supper  . multivitamin with minerals  1 tablet Oral Q breakfast  . senna  1 tablet Oral BID  . sodium chloride flush  3 mL Intravenous Q12H   Continuous Infusions: . sodium chloride     PRN Meds: sodium chloride, acetaminophen **OR** acetaminophen, acetaminophen, naproxen, ondansetron (ZOFRAN) IV, phenol, sodium chloride flush   Vital Signs    Vitals:   01/10/20 0447 01/10/20 0831 01/10/20 0851 01/10/20 1036  BP: (!) 126/54 (!) 91/53 104/77 118/90  Pulse: 65 67 69 73  Resp: 18 20 20 16  Temp: 98.3 F (36.8 C) 98.3 F (36.8 C)  98 F (36.7 C)  TempSrc: Oral Oral  Oral  SpO2: 100% 97% 97% 98%  Weight: 125.9 kg     Height:        Intake/Output Summary (Last 24 hours) at 01/10/2020 1043 Last data filed at 01/10/2020 0854 Gross per 24 hour  Intake 123 ml  Output --  Net 123 ml   Filed Weights   01/08/20 0824 01/09/20 0515 01/10/20 0447  Weight: 129.3 kg 126.7 kg 125.9 kg    Telemetry    Sinus, no further arrhythmias - Personally Reviewed  Physical Exam   GEN- The patient is well appearing, alert and oriented x 3 today.   Head- normocephalic, atraumatic Eyes-  Sclera clear, conjunctiva pink Ears- hearing intact Oropharynx- clear Neck- supple, Lungs-  normal work of breathing Heart- Regular rate and rhythm  GI- soft  Extremities- no clubbing, cyanosis, or edema  MS- no significant deformity or atrophy Skin- no rash or lesion Psych- euthymic mood, full affect Neuro- strength and sensation are intact   Labs    Chemistry Recent Labs  Lab 01/09/20 0219  NA 140  K 4.0  CL 106  CO2 26  GLUCOSE 98  BUN 11  CREATININE 0.93  CALCIUM 9.4  GFRNONAA >60  ANIONGAP 8     Hematology Recent Labs  Lab 01/09/20 0219  WBC 10.2  RBC 4.66   HGB 12.6*  HCT 38.7*  MCV 83.0  MCH 27.0  MCHC 32.6  RDW 13.9  PLT 265     Patient ID  Xavier Johnstonis a 44 y.o.malewith no past medical history presents as a follow up after recent hospitalization for an episode of wide complex tachycardia.  He was found to have inducible VT on EPS and is admitted for cath and cardiac MRI on Monday.  Assessment & Plan    1.  Ventricular tachycardia Inducible VT at EPS.  He has a preserved EF. Workup to include cardiac MRI and cath ordered for tomorrow.  Today, I discussed the risks of cath with the patient. The patient understands that risks included but are not limited to stroke (1 in 1000), death (1 in 1000), kidney failure [usually temporary] (1 in 500), bleeding (1 in 200), allergic reaction [possibly serious] (1 in 200). The patient understands and agrees to proceed.  Orders placed and he has been placed on the schedule.  If he has no structural heart disease, then I would advise beta blocker therapy, without ICD going forward.  If he has structural heart disease of irreversible nature on MRI, then perhaps ICD could be considered at that point depending on its   severity.  Dr Lalla Brothers to reassess tomorrow  Hillis Range MD, [facility] 01/10/2020 10:43 AM

## 2020-01-10 NOTE — Progress Notes (Signed)
Progress Note   Subjective   Doing well today, the patient denies CP or SOB.  No new concerns  Inpatient Medications    Scheduled Meds: . docusate sodium  100 mg Oral BID  . metoprolol succinate  50 mg Oral Q supper  . multivitamin with minerals  1 tablet Oral Q breakfast  . senna  1 tablet Oral BID  . sodium chloride flush  3 mL Intravenous Q12H   Continuous Infusions: . sodium chloride     PRN Meds: sodium chloride, acetaminophen **OR** acetaminophen, acetaminophen, naproxen, ondansetron (ZOFRAN) IV, phenol, sodium chloride flush   Vital Signs    Vitals:   01/10/20 0447 01/10/20 0831 01/10/20 0851 01/10/20 1036  BP: (!) 126/54 (!) 91/53 104/77 118/90  Pulse: 65 67 69 73  Resp: 18 20 20 16   Temp: 98.3 F (36.8 C) 98.3 F (36.8 C)  98 F (36.7 C)  TempSrc: Oral Oral  Oral  SpO2: 100% 97% 97% 98%  Weight: 125.9 kg     Height:        Intake/Output Summary (Last 24 hours) at 01/10/2020 1043 Last data filed at 01/10/2020 0854 Gross per 24 hour  Intake 123 ml  Output --  Net 123 ml   Filed Weights   01/08/20 0824 01/09/20 0515 01/10/20 0447  Weight: 129.3 kg 126.7 kg 125.9 kg    Telemetry    Sinus, no further arrhythmias - Personally Reviewed  Physical Exam   GEN- The patient is well appearing, alert and oriented x 3 today.   Head- normocephalic, atraumatic Eyes-  Sclera clear, conjunctiva pink Ears- hearing intact Oropharynx- clear Neck- supple, Lungs-  normal work of breathing Heart- Regular rate and rhythm  GI- soft  Extremities- no clubbing, cyanosis, or edema  MS- no significant deformity or atrophy Skin- no rash or lesion Psych- euthymic mood, full affect Neuro- strength and sensation are intact   Labs    Chemistry Recent Labs  Lab 01/09/20 0219  NA 140  K 4.0  CL 106  CO2 26  GLUCOSE 98  BUN 11  CREATININE 0.93  CALCIUM 9.4  GFRNONAA >60  ANIONGAP 8     Hematology Recent Labs  Lab 01/09/20 0219  WBC 10.2  RBC 4.66   HGB 12.6*  HCT 38.7*  MCV 83.0  MCH 27.0  MCHC 32.6  RDW 13.9  PLT 265     Patient ID  Xavier L Purcellis a 44 y.o.malewith no past medical history presents as a follow up after recent hospitalization for an episode of wide complex tachycardia.  He was found to have inducible VT on EPS and is admitted for cath and cardiac MRI on Monday.  Assessment & Plan    1.  Ventricular tachycardia Inducible VT at EPS.  He has a preserved EF. Workup to include cardiac MRI and cath ordered for tomorrow.  Today, I discussed the risks of cath with the patient. The patient understands that risks included but are not limited to stroke (1 in 1000), death (1 in 1000), kidney failure [usually temporary] (1 in 500), bleeding (1 in 200), allergic reaction [possibly serious] (1 in 200). The patient understands and agrees to proceed.  Orders placed and he has been placed on the schedule.  If he has no structural heart disease, then I would advise beta blocker therapy, without ICD going forward.  If he has structural heart disease of irreversible nature on MRI, then perhaps ICD could be considered at that point depending on its  severity.  Dr Lalla Brothers to reassess tomorrow  Hillis Range MD, [facility] 01/10/2020 10:43 AM

## 2020-01-11 ENCOUNTER — Encounter (HOSPITAL_COMMUNITY)
Admission: RE | Disposition: A | Discharge status: Home / Self Care | Payer: Self-pay | Source: Home / Self Care | Attending: Cardiology

## 2020-01-11 ENCOUNTER — Inpatient Hospital Stay (HOSPITAL_COMMUNITY): Payer: BC Managed Care – PPO

## 2020-01-11 ENCOUNTER — Encounter (HOSPITAL_COMMUNITY): Payer: Self-pay | Admitting: Cardiology

## 2020-01-11 DIAGNOSIS — I472 Ventricular tachycardia: Secondary | ICD-10-CM

## 2020-01-11 HISTORY — PX: LEFT HEART CATH AND CORONARY ANGIOGRAPHY: CATH118249

## 2020-01-11 SURGERY — LEFT HEART CATH AND CORONARY ANGIOGRAPHY
Anesthesia: LOCAL

## 2020-01-11 MED ORDER — MIDAZOLAM HCL 2 MG/2ML IJ SOLN
INTRAMUSCULAR | Status: AC
  Filled 2020-01-11: qty 2

## 2020-01-11 MED ORDER — LABETALOL HCL 5 MG/ML IV SOLN: 10.0000 mg | INTRAVENOUS | Status: AC | PRN

## 2020-01-11 MED ORDER — VERAPAMIL HCL 2.5 MG/ML IV SOLN
INTRAVENOUS | Status: AC
  Filled 2020-01-11: qty 2

## 2020-01-11 MED ORDER — HYDRALAZINE HCL 20 MG/ML IJ SOLN: 10.0000 mg | INTRAMUSCULAR | Status: AC | PRN

## 2020-01-11 MED ORDER — OXYCODONE HCL 5 MG PO TABS: 5.0000 mg | ORAL_TABLET | ORAL | Status: DC | PRN

## 2020-01-11 MED ORDER — SODIUM CHLORIDE 0.9 % IV SOLN: INTRAVENOUS | Status: AC

## 2020-01-11 MED ORDER — ACETAMINOPHEN 325 MG PO TABS: 650.0000 mg | ORAL_TABLET | ORAL | Status: DC | PRN

## 2020-01-11 MED ORDER — SODIUM CHLORIDE 0.9 % IV SOLN: 250.0000 mL | INTRAVENOUS | Status: DC | PRN

## 2020-01-11 MED ORDER — ONDANSETRON HCL 4 MG/2ML IJ SOLN: 4.0000 mg | Freq: Four times a day (QID) | INTRAMUSCULAR | Status: DC | PRN

## 2020-01-11 MED ORDER — ASPIRIN 81 MG PO CHEW
81.0000 mg | CHEWABLE_TABLET | Freq: Once | ORAL | Status: AC
  Administered 2020-01-11: 81 mg via ORAL
  Filled 2020-01-11: qty 1

## 2020-01-11 MED ORDER — IOHEXOL 350 MG/ML SOLN
INTRAVENOUS | Status: DC | PRN
  Administered 2020-01-11: 50 mL via INTRA_ARTERIAL

## 2020-01-11 MED ORDER — MIDAZOLAM HCL 2 MG/2ML IJ SOLN
INTRAMUSCULAR | Status: DC | PRN
  Administered 2020-01-11: 1 mg via INTRAVENOUS

## 2020-01-11 MED ORDER — LIDOCAINE HCL (PF) 1 % IJ SOLN
INTRAMUSCULAR | Status: DC | PRN
  Administered 2020-01-11: 2 mL via INTRADERMAL

## 2020-01-11 MED ORDER — FENTANYL CITRATE (PF) 100 MCG/2ML IJ SOLN
INTRAMUSCULAR | Status: DC | PRN
  Administered 2020-01-11: 50 ug via INTRAVENOUS

## 2020-01-11 MED ORDER — SODIUM CHLORIDE 0.9% FLUSH
3.0000 mL | Freq: Two times a day (BID) | INTRAVENOUS | Status: DC
  Administered 2020-01-11 – 2020-01-12 (×2): 3 mL via INTRAVENOUS

## 2020-01-11 MED ORDER — VERAPAMIL HCL 2.5 MG/ML IV SOLN
INTRAVENOUS | Status: DC | PRN
  Administered 2020-01-11: 10 mL via INTRA_ARTERIAL

## 2020-01-11 MED ORDER — LIDOCAINE HCL (PF) 1 % IJ SOLN
INTRAMUSCULAR | Status: AC
  Filled 2020-01-11: qty 30

## 2020-01-11 MED ORDER — HEPARIN SODIUM (PORCINE) 1000 UNIT/ML IJ SOLN
INTRAMUSCULAR | Status: AC
  Filled 2020-01-11: qty 1

## 2020-01-11 MED ORDER — FENTANYL CITRATE (PF) 100 MCG/2ML IJ SOLN
INTRAMUSCULAR | Status: AC
  Filled 2020-01-11: qty 2

## 2020-01-11 MED ORDER — SODIUM CHLORIDE 0.9% FLUSH: 3.0000 mL | INTRAVENOUS | Status: DC | PRN

## 2020-01-11 MED ORDER — GADOBUTROL 1 MMOL/ML IV SOLN
10.0000 mL | Freq: Once | INTRAVENOUS | Status: AC | PRN
  Administered 2020-01-11: 10 mL via INTRAVENOUS

## 2020-01-11 MED ORDER — HEPARIN (PORCINE) IN NACL 1000-0.9 UT/500ML-% IV SOLN
INTRAVENOUS | Status: AC
  Filled 2020-01-11: qty 1000

## 2020-01-11 MED ORDER — HEPARIN SODIUM (PORCINE) 1000 UNIT/ML IJ SOLN
INTRAMUSCULAR | Status: DC | PRN
  Administered 2020-01-11: 6000 [IU] via INTRAVENOUS

## 2020-01-11 MED ORDER — HEPARIN (PORCINE) IN NACL 1000-0.9 UT/500ML-% IV SOLN
INTRAVENOUS | Status: DC | PRN
  Administered 2020-01-11 (×2): 500 mL

## 2020-01-11 SURGICAL SUPPLY — 10 items
CATH 5FR JL3.5 JR4 ANG PIG MP (CATHETERS) ×1 IMPLANT
DEVICE RAD COMP TR BAND LRG (VASCULAR PRODUCTS) ×1 IMPLANT
GLIDESHEATH SLEND A-KIT 6F 22G (SHEATH) ×1 IMPLANT
GUIDEWIRE INQWIRE 1.5J.035X260 (WIRE) IMPLANT
INQWIRE 1.5J .035X260CM (WIRE) ×2
KIT HEART LEFT (KITS) ×2 IMPLANT
PACK CARDIAC CATHETERIZATION (CUSTOM PROCEDURE TRAY) ×2 IMPLANT
SHEATH PROBE COVER 6X72 (BAG) ×1 IMPLANT
TRANSDUCER W/STOPCOCK (MISCELLANEOUS) ×2 IMPLANT
TUBING CIL FLEX 10 FLL-RA (TUBING) ×2 IMPLANT

## 2020-01-11 NOTE — Anesthesia Postprocedure Evaluation (Signed)
Anesthesia Post Note  Patient: Xavier Johnston  Procedure(s) Performed: SVT ABLATION (N/A )     Patient location during evaluation: PACU Anesthesia Type: General Level of consciousness: awake and alert Pain management: pain level controlled Vital Signs Assessment: post-procedure vital signs reviewed and stable Respiratory status: spontaneous breathing, nonlabored ventilation, respiratory function stable and patient connected to nasal cannula oxygen Cardiovascular status: blood pressure returned to baseline and stable Postop Assessment: no apparent nausea or vomiting Anesthetic complications: no   No complications documented.  Last Vitals:  Vitals:   01/11/20 0501 01/11/20 0940  BP: (!) 96/58 126/79  Pulse: 65 65  Resp: 20 18  Temp: 36.9 C 36.6 C  SpO2:  98%    Last Pain:  Vitals:   01/11/20 0940  TempSrc: Oral  PainSc: 0-No pain                 Kadiatou Oplinger S

## 2020-01-11 NOTE — Transfer of Care (Signed)
Immediate Anesthesia Transfer of Care Note  Patient: Xavier Johnston  Procedure(s) Performed: SVT ABLATION (N/A )  Patient Location: PACU  Anesthesia Type:General  Level of Consciousness: awake, alert  and patient cooperative  Airway & Oxygen Therapy: Patient Spontanous Breathing  Post-op Assessment: Report given to RN and Post -op Vital signs reviewed and stable  Post vital signs: Reviewed and stable  Last Vitals:  Vitals Value Taken Time  BP 96/58 01/11/20 0502  Temp 36.9 C 01/11/20 0501  Pulse 65 01/11/20 0501  Resp 20 01/11/20 0501  SpO2 98 % 01/10/20 1954  Vitals shown include unvalidated device data.  Last Pain:  Vitals:   01/11/20 0501  TempSrc: Oral  PainSc:          Complications: No complications documented.

## 2020-01-11 NOTE — Interval H&P Note (Signed)
Cath Lab Visit (complete for each Cath Lab visit)  Clinical Evaluation Leading to the Procedure:   ACS: Yes.    Non-ACS:    Anginal Classification: CCS II  Anti-ischemic medical therapy: No Therapy  Non-Invasive Test Results: No non-invasive testing performed  Prior CABG: No previous CABG      History and Physical Interval Note:  01/11/2020 10:48 AM  Xavier Johnston  has presented today for surgery, with the diagnosis of NSTEMI.  The various methods of treatment have been discussed with the patient and family. After consideration of risks, benefits and other options for treatment, the patient has consented to  Procedure(s): LEFT HEART CATH AND CORONARY ANGIOGRAPHY (N/A) as a surgical intervention.  The patient's history has been reviewed, patient examined, no change in status, stable for surgery.  I have reviewed the patient's chart and labs.  Questions were answered to the patient's satisfaction.     Lyn Records III

## 2020-01-11 NOTE — CV Procedure (Signed)
   Normal coronary arteries  Normal left ventricular function with EF 55%.  Normal LVEDP.

## 2020-01-11 NOTE — Progress Notes (Signed)
Progress Note  Patient Name: BUBBER ROTHERT Date of Encounter: 01/11/2020  [facility] Cardiologist/Eleectrophysiologist: Dr. Lalla Brothers  Subjective   Doing well this morning. I saw him after his MRI (results still pending). Left heart catheterization has been performed and shows normal coronary arteries and normal LVEDP.  Inpatient Medications    Scheduled Meds: . docusate sodium  100 mg Oral BID  . metoprolol succinate  50 mg Oral Q supper  . multivitamin with minerals  1 tablet Oral Q breakfast  . senna  1 tablet Oral BID  . sodium chloride flush  3 mL Intravenous Q12H   Continuous Infusions: . sodium chloride    . sodium chloride    . sodium chloride 1 mL/kg/hr (01/11/20 0522)   PRN Meds: sodium chloride, sodium chloride, acetaminophen **OR** acetaminophen, acetaminophen, naproxen, ondansetron (ZOFRAN) IV, phenol, sodium chloride flush, sodium chloride flush   Vital Signs    Vitals:   01/10/20 1648 01/10/20 1954 01/11/20 0058 01/11/20 0501  BP: (!) 127/95 124/85 (!) 110/56 (!) 96/58  Pulse: 83 70 65 65  Resp:  20 20 20   Temp:  98.6 F (37 C) 98 F (36.7 C) 98.4 F (36.9 C)  TempSrc:  Oral Oral Oral  SpO2:  98%    Weight:    130.6 kg  Height:        Intake/Output Summary (Last 24 hours) at 01/11/2020 0819 Last data filed at 01/10/2020 1200 Gross per 24 hour  Intake 225 ml  Output --  Net 225 ml   Last 3 Weights 01/11/2020 01/10/2020 01/09/2020  Weight (lbs) 288 lb 277 lb 7.9 oz 279 lb 6.4 oz  Weight (kg) 130.636 kg 125.87 kg 126.735 kg      Telemetry    SR, 60's - Personally Reviewed  ECG    No new EKGs - Personally Reviewed  Physical Exam   Vitals with BMI 01/11/2020 01/11/2020 01/11/2020  Height - - -  Weight - - -  BMI - - -  Systolic 128 124 01/13/2020  Diastolic 88 85 85  Pulse 66 67 64    GEN: No acute distress.   Neck: No JVD Cardiac:  RRR, no murmurs, rubs, or gallops.  Respiratory:  CTA b/l. GI: Soft, nontender, non-distended   MS: No edema; No deformity. Neuro:  Nonfocal  Psych: Normal affect   Labs    High Sensitivity Troponin:  No results for input(s): TROPONINIHS in the last 720 hours.    Chemistry Recent Labs  Lab 01/09/20 0219  NA 140  K 4.0  CL 106  CO2 26  GLUCOSE 98  BUN 11  CREATININE 0.93  CALCIUM 9.4  GFRNONAA >60  ANIONGAP 8     Hematology Recent Labs  Lab 01/09/20 0219  WBC 10.2  RBC 4.66  HGB 12.6*  HCT 38.7*  MCV 83.0  MCH 27.0  MCHC 32.6  RDW 13.9  PLT 265    BNPNo results for input(s): BNP, PROBNP in the last 168 hours.   DDimer No results for input(s): DDIMER in the last 168 hours.   Radiology    No results found.  Cardiac Studies   11/17/2019 Exercise Treadmill  Blood pressure demonstrated a normal response to exercise.  There was no ST segment deviation noted during stress.  Negative ETT Patient only achieved 87% PMHR Rare PVC;s in peak stress/recovery Normal hemodynamic response   10/23/2019 Echo 1. Left ventricular ejection fraction, by estimation, is 60 to 65%. The  left ventricle has normal function.  The left ventricle has no regional  wall motion abnormalities. Left ventricular diastolic parameters were  normal.  2. Right ventricular systolic function is normal. The right ventricular  size is normal.  3. The mitral valve is normal in structure. No evidence of mitral valve  regurgitation. No evidence of mitral stenosis.  4. The aortic valve is tricuspid. Aortic valve regurgitation is not  visualized. No aortic stenosis is present.  5. Aortic dilatation noted. 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  6. The inferior vena cava is normal in size with greater than 50%  respiratory variability, suggesting right atrial pressure of 3 mmHg.    10/22/2019 NTZ:GYFV tachycardia at 220 bpm.   Patient Profile     44 y.o. male with no notable PMHx referred out patient to EP  For WCT underwent EPS that revealed VT  admitted for further evaluation and management  Assessment & Plan    1. VT Two separate inducible VTs during EP study. Normal heart by echo, awaiting results of MRI which was completed earlier. LHC normal. Plan to review results of cMR with patient and family before deciding on next steps (continued monitoring+med therapy+loop recorder vs. ICD)  For questions or updates, please contact [facility] Please consult [email or web address] for contact info under     [employer]. Lalla Brothers, MD, [facility] Cardiac Electrophysiology

## 2020-01-12 ENCOUNTER — Encounter (HOSPITAL_COMMUNITY)
Admission: RE | Disposition: A | Discharge status: Home / Self Care | Payer: Self-pay | Source: Home / Self Care | Attending: Cardiology

## 2020-01-12 DIAGNOSIS — I472 Ventricular tachycardia: Secondary | ICD-10-CM

## 2020-01-12 HISTORY — PX: LOOP RECORDER INSERTION: EP1214

## 2020-01-12 SURGERY — LOOP RECORDER INSERTION

## 2020-01-12 MED ORDER — VERAPAMIL HCL ER 120 MG PO TBCR: 120.0000 mg | EXTENDED_RELEASE_TABLET | Freq: Every day | ORAL | 6 refills | Status: DC

## 2020-01-12 MED ORDER — LIDOCAINE-EPINEPHRINE 1 %-1:100000 IJ SOLN
INTRAMUSCULAR | Status: AC
  Filled 2020-01-12: qty 1

## 2020-01-12 MED ORDER — VERAPAMIL HCL ER 120 MG PO TBCR
120.0000 mg | EXTENDED_RELEASE_TABLET | Freq: Every day | ORAL | Status: DC
  Administered 2020-01-12: 120 mg via ORAL
  Filled 2020-01-12: qty 1

## 2020-01-12 SURGICAL SUPPLY — 2 items
MONITOR REVEAL LINQ II (Prosthesis & Implant Heart) ×1 IMPLANT
PACK LOOP INSERTION (CUSTOM PROCEDURE TRAY) ×2 IMPLANT

## 2020-01-12 NOTE — Procedures (Signed)
SURGEON:  Steffanie Dunn, MD    PREPROCEDURE DIAGNOSIS:  VT    POSTPROCEDURE DIAGNOSIS:  VT     PROCEDURES:   1. Implantable loop recorder implantation    INTRODUCTION:  Xavier Johnston is a 44 y.o. male with a history of VT for implantable loop implantation.      DESCRIPTION OF PROCEDURE:  Informed written consent was obtained.  The patient required no sedation for the procedure today.  Mapping over the patient's chest was performed to identify the area where electrograms were most prominent for ILR recording.  This area was found to be the left parasternal region over the 3rd-4th intercostal space. The patients left chest was therefore prepped and draped in the usual sterile fashion. The skin overlying the left parasternal region was infiltrated with lidocaine for local analgesia.  A 0.5-cm incision was made over the left parasternal region over the 3rd intercostal space.  A subcutaneous ILR pocket was fashioned using a combination of sharp and blunt dissection.  A Med[employer]onic Reveal Linq model [id number] implantable loop recorder was then placed into the pocket  R waves were very prominent and measured >0.51mV.  Steri- Strips and a sterile dressing were then applied.  There were no early apparent complications.     CONCLUSIONS:   1. Successful implantation of a Med[employer]onic Reveal LINQ implantable loop recorder for VT  2. No early apparent complications.   Steffanie Dunn, MD 01/12/2020 5:16 PM

## 2020-01-12 NOTE — Discharge Summary (Addendum)
ELECTROPHYSIOLOGY PROCEDURE DISCHARGE SUMMARY    Patient ID: Xavier Johnston,  MRN: [id number], DOB/AGE: 1976/02/06 44 y.o.  Admit date: 01/08/2020 Discharge date: 01/12/2020  Primary Care Physician: Maud Deed, [place]  Primary Cardiologist/Electrophysiologist: Dr. Lalla Brothers  Primary Discharge Diagnosis:  1. VT  Secondary Discharge Diagnosis:  1. obesity  No Known Allergies   Procedures This Admission:  1. EPS 01/08/2020 CONCLUSIONS:  1. Sinus rhythm upon presentation.  2.  Sustained ventricular tachycardia inducible at EP study 3. AV conduction appears normal 4. No early apparent complications.   2.  LHC 01/11/2020  Codominant coronary anatomy.  Normal left main  Normal LAD that wraps around the left ventricular apex.  Normal dominant circumflex.  Nondominant right coronary.  Normal left ventricular systolic function with EF 55%.  LVEDP 18 mmHg.  3. Implantation of a MDT ILR   01/12/2020    Brief HPI: Xavier Johnston is a 44 y.o. male with no notable PMHX until recently found to have WCT suspect to be an SVT and brought to [facility] 01/08/2020 for elective EPS/ablation. The study noted inducible VT and he was admitted for further work up.   Hospital Course:  The patient was admitted and monitored on telemetry without arrhythmia throughout his stay. He underwent LHC yesterday with no CAD, , LVEDP 18 and LVEF 55%. He also had c.MRI noting  1.  Normal LV size, mild LVH, and normal systolic function (EF 63%), 2.  Normal RV size and systolic function (EF 51%), 3. RV insertion site late gadolinium enhancement, which is a nonspecific finding often seen in setting of elevated pulmonary pressures. No other LGE seen.  With the patient's prior presentation with WCT, while he was aware of the tachycardia, somewhat diaphoretic, no associated near syncope or  syncope, or hemodynamic instability.  Plan to stop his toprol and start verapamil.  Dr. Lalla Brothers discussed at  length with the patient his test results and recommendations, including loop implant to monitor any arrhythmia burden and help guide medical therapy. The patient agreeable to the plan He was examined by this morning by Dr. Lalla Brothers considered stable for discharge to home after loop implant.   Wound care was discussed with the patient  Physical Exam: Vitals:   01/12/20 0522 01/12/20 0828 01/12/20 1022 01/12/20 1024  BP: 110/70 121/90 114/79 114/79  Pulse:  70  75  Resp: 18 16  18   Temp: 98.8 F (37.1 C) 98.4 F (36.9 C)  98.4 F (36.9 C)  TempSrc: Oral Oral  Oral  SpO2: 100% 96%  97%  Weight: 130.1 kg     Height:        GEN- The patient is well appearing, alert and oriented x 3 today.   HEENT: normocephalic, atraumatic; sclera clear, conjunctiva pink; hearing intact; oropharynx clear Lungs- CTA b/l, normal work of breathing.  No wheezes, rales, rhonchi Heart- RRR, no murmurs, rubs or gallops, PMI not laterally displaced Loop site is stable, no bleeding GI- soft, non-tender, non-distended Extremities- no clubbing, cyanosis, or edema. R radial site is stable, no hematoma or pain, excellent radial pulse R groin, also stable, no hematoma, no pain MS- no significant deformity or atrophy Skin- warm and dry, no rash or lesion Psych- euthymic mood, full affect Neuro- no gross defecits  Labs:   Lab Results  Component Value Date   WBC 10.2 01/09/2020   HGB 12.6 (L) 01/09/2020   HCT 38.7 (L) 01/09/2020   MCV 83.0 01/09/2020   PLT 265 01/09/2020  Recent Labs  Lab 01/09/20 0219  NA 140  K 4.0  CL 106  CO2 26  BUN 11  CREATININE 0.93  CALCIUM 9.4  GLUCOSE 98    Discharge Medications:  Allergies as of 01/12/2020   No Known Allergies      Medication List     STOP taking these medications    metoprolol succinate 50 MG 24 hr tablet Commonly known as: Toprol XL       TAKE these medications    multivitamin with minerals Tabs tablet Take 1 tablet by mouth  daily with breakfast.   naproxen sodium 220 MG tablet Commonly known as: ALEVE Take 220-440 mg by mouth 2 (two) times daily as needed (pain.).   verapamil 120 MG CR tablet Commonly known as: CALAN-SR Take 1 tablet (120 mg total) by mouth daily.        Disposition: Home Discharge Instructions     Diet - low sodium heart healthy   Complete by: As directed    Increase activity slowly   Complete by: As directed        Follow-up Information     Lanier Prude, MD Follow up.   Specialties: Cardiology, Radiology Why: 02/08/2020 @ 9:30AM Contact information: [address] [place] [address] [phone]         [facility] [address] Office Follow up.   Specialty: Cardiology Why: 01/19/2020 @ 8:40AM, wound check (monitor implant) Contact information: [address], [address] [place] [address] [phone]                Duration of Discharge Encounter: Greater than 30 minutes including physician time.  Signed, Francis Dowse, PA-C 01/12/2020 2:33 PM     ------------------------------------------------------------------------------------------ I have seen, examined the patient, and reviewed the above assessment and plan.    [name] is doing well without complaint today.  MRI yesterday showed no LGE and a structurally normal heart.  LV function was normal.  Left heart catheterization showed normal coronary arteries.  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  Unfortunately I was unable to induce this rhythm in the EP lab.  Thankfully he is only had one episode and has tolerated medical therapy well.  Given the completely normal MRI and echo and coronary angiography, I have recommended to the patient that we use medical therapy.  Instead of the beta-blocker that he has previously been on, will transition to verapamil given its documented efficacy for  Purkinje related VT.  I have also implanted a loop recorder today to help monitor for recurrent episodes of VT or asymptomatic nonsustained ventricular tachycardia.  If, in the future, the patient experiences more ventricular tachycardia will recommend that we pursue an additional attempt at EP study and ablation.  Any future attempts at EP study or ablation should be done under no or very light sedation.  I will plan to follow him closely in the outpatient setting.  Lanier Prude, MD 01/12/2020 5:12 PM

## 2020-01-12 NOTE — Discharge Instructions (Signed)
Implant site wound care instructions Keep incision clean and dry for 3 days. You can remove outer dressing tomorrow. Leave steri-strips (little pieces of tape) on until seen in the office for wound check appointment. Call the office [phone]) for redness, drainage, swelling, or fever.    Radial Site Care  This sheet gives you information about how to care for yourself after your procedure. Your health care provider may also give you more specific instructions. If you have problems or questions, contact your health care provider. What can I expect after the procedure? After the procedure, it is common to have:  Bruising and tenderness at the catheter insertion area. Follow these instructions at home: Medicines  Take over-the-counter and prescription medicines only as told by your health care provider. Insertion site care  Follow instructions from your health care provider about how to take care of your insertion site. Make sure you: ? Wash your hands with soap and water before you change your bandage (dressing). If soap and water are not available, use hand sanitizer. ? Change your dressing as told by your health care provider. ? Leave stitches (sutures), skin glue, or adhesive strips in place. These skin closures may need to stay in place for 2 weeks or longer. If adhesive strip edges start to loosen and curl up, you may trim the loose edges. Do not remove adhesive strips completely unless your health care provider tells you to do that.  Check your insertion site every day for signs of infection. Check for: ? Redness, swelling, or pain. ? Fluid or blood. ? Pus or a bad smell. ? Warmth.  Do not take baths, swim, or use a hot tub until your health care provider approves.  You may shower 24-48 hours after the procedure, or as directed by your health care provider. ? Remove the dressing and gently wash the site with plain soap and water. ? Pat the area dry with a clean towel. ? Do  not rub the site. That could cause bleeding.  Do not apply powder or lotion to the site. Activity   For 24 hours after the procedure, or as directed by your health care provider: ? Do not flex or bend the affected arm. ? Do not push or pull heavy objects with the affected arm. ? Do not drive yourself home from the hospital or clinic. You may drive 24 hours after the procedure unless your health care provider tells you not to. ? Do not operate machinery or power tools.  Do not lift anything that is heavier than 10 lb (4.5 kg), or the limit that you are told, until your health care provider says that it is safe.  Ask your health care provider when it is okay to: ? Return to work or school. ? Resume usual physical activities or sports. ? Resume sexual activity. General instructions  If the catheter site starts to bleed, raise your arm and put firm pressure on the site. If the bleeding does not stop, get help right away. This is a medical emergency.  If you went home on the same day as your procedure, a responsible adult should be with you for the first 24 hours after you arrive home.  Keep all follow-up visits as told by your health care provider. This is important. Contact a health care provider if:  You have a fever.  You have redness, swelling, or yellow drainage around your insertion site. Get help right away if:  You have unusual pain at the  radial site.  The catheter insertion area swells very fast.  The insertion area is bleeding, and the bleeding does not stop when you hold steady pressure on the area.  Your arm or hand becomes pale, cool, tingly, or numb. These symptoms may represent a serious problem that is an emergency. Do not wait to see if the symptoms will go away. Get medical help right away. Call your local emergency services (911 in the [place] Do not drive yourself to the hospital. Summary  After the procedure, it is common to have bruising and tenderness at  the site.  Follow instructions from your health care provider about how to take care of your radial site wound. Check the wound every day for signs of infection.  Do not lift anything that is heavier than 10 lb (4.5 kg), or the limit that you are told, until your health care provider says that it is safe. This information is not intended to replace advice given to you by your health care provider. Make sure you discuss any questions you have with your health care provider. Document Revised: 04/10/2017 Document Reviewed: 04/10/2017 Elsevier Patient Education  2020 [employer]    Groin care instructions Post procedure care instructions No driving for 4 days. No lifting over 5 lbs for 1 week. No vigorous or sexual activity for 1 week. You may return to work/your usual activities on 01/18/2020. Keep procedure site clean & dry. If you notice increased pain, swelling, bleeding or pus, call/return!  You may shower, but no soaking baths/hot tubs/pools for 1 week.

## 2020-01-14 ENCOUNTER — Encounter (HOSPITAL_COMMUNITY): Payer: Self-pay | Admitting: Cardiology

## 2020-01-19 ENCOUNTER — Ambulatory Visit (INDEPENDENT_AMBULATORY_CARE_PROVIDER_SITE_OTHER): Payer: BC Managed Care – PPO | Admitting: Emergency Medicine

## 2020-01-19 ENCOUNTER — Ambulatory Visit: Payer: BC Managed Care – PPO

## 2020-01-19 ENCOUNTER — Other Ambulatory Visit: Payer: Self-pay

## 2020-01-19 ENCOUNTER — Encounter (HOSPITAL_BASED_OUTPATIENT_CLINIC_OR_DEPARTMENT_OTHER): Payer: Self-pay

## 2020-01-19 ENCOUNTER — Emergency Department (HOSPITAL_BASED_OUTPATIENT_CLINIC_OR_DEPARTMENT_OTHER)
Admission: EM | Admit: 2020-01-19 | Discharge: 2020-01-19 | Disposition: A | Discharge status: Home / Self Care | Payer: BC Managed Care – PPO | Attending: Emergency Medicine | Admitting: Emergency Medicine

## 2020-01-19 DIAGNOSIS — Z5189 Encounter for other specified aftercare: Secondary | ICD-10-CM

## 2020-01-19 DIAGNOSIS — I472 Ventricular tachycardia, unspecified: Secondary | ICD-10-CM

## 2020-01-19 DIAGNOSIS — Z9861 Coronary angioplasty status: Secondary | ICD-10-CM | POA: Diagnosis not present

## 2020-01-19 DIAGNOSIS — Z4800 Encounter for change or removal of nonsurgical wound dressing: Secondary | ICD-10-CM | POA: Insufficient documentation

## 2020-01-19 DIAGNOSIS — Z9889 Other specified postprocedural states: Secondary | ICD-10-CM

## 2020-01-19 HISTORY — DX: Ventricular tachycardia, unspecified: I47.20

## 2020-01-19 HISTORY — DX: Ventricular tachycardia: I47.2

## 2020-01-19 LAB — CUP PACEART INCLINIC DEVICE CHECK
Date Time Interrogation Session: 20211102180547
Implantable Pulse Generator Implant Date: 20211026

## 2020-01-19 MED ORDER — DOXYCYCLINE HYCLATE 100 MG PO CAPS: 100.0000 mg | ORAL_CAPSULE | Freq: Two times a day (BID) | ORAL | 0 refills | Status: DC

## 2020-01-19 MED ORDER — DOXYCYCLINE HYCLATE 100 MG PO TABS
100.0000 mg | ORAL_TABLET | Freq: Once | ORAL | Status: AC
  Administered 2020-01-19: 100 mg via ORAL
  Filled 2020-01-19: qty 1

## 2020-01-19 NOTE — Patient Instructions (Signed)
Call the Device clinic if you have any questions or concerns about your wound site or your loop recoreder. Device Clinic: [phone]

## 2020-01-19 NOTE — ED Triage Notes (Signed)
EP lab study, entry through groin, today noticed a pus like discharge from area. Denies fever or warmth.

## 2020-01-19 NOTE — Progress Notes (Signed)
ILR wound check in clinic. Steri strips removed. Wound well healed. Home monitor transmitting nightly. No episodes. Questions answered.  

## 2020-01-19 NOTE — ED Provider Notes (Signed)
[facility] Provider Note: Xavier Dell, MD, FACEP  CSN: [id number] MRN: [id number] ARRIVAL: 01/19/20 at 0057 ROOM: MH09/MH09   CHIEF COMPLAINT  Wound Check   HISTORY OF PRESENT ILLNESS  01/19/20 3:26 AM Xavier Johnston is a 44 y.o. male who had a a right transfemoral left heart catheterization and coronary angiography on 01/11/2020.  Yesterday he noticed a clear discharge from the femoral puncture site.  He denies any fever, chills or associated erythema, pain or tenderness.    Past Medical History:  Diagnosis Date  . Ventricular tachycardia [facility])     Past Surgical History:  Procedure Laterality Date  . LEFT HEART CATH AND CORONARY ANGIOGRAPHY N/A 01/11/2020   Procedure: LEFT HEART CATH AND CORONARY ANGIOGRAPHY;  Surgeon: Lyn Records, MD;  Location: [facility] INVASIVE CV LAB;  Service: Cardiovascular;  Laterality: N/A;  . LOOP RECORDER INSERTION N/A 01/12/2020   Procedure: LOOP RECORDER INSERTION;  Surgeon: Lanier Prude, MD;  Location: [facility] INVASIVE CV LAB;  Service: Cardiovascular;  Laterality: N/A;  . SVT ABLATION N/A 01/08/2020   Procedure: SVT ABLATION;  Surgeon: Lanier Prude, MD;  Location: [facility] INVASIVE CV LAB;  Service: Cardiovascular;  Laterality: N/A;  . vastectomy      Family History  Problem Relation Age of Onset  . Heart attack Father   . Sudden Cardiac Death Father   . Diabetes Maternal Grandmother   . Cancer Paternal Grandmother     Social History   Tobacco Use  . Smoking status: Never Smoker  . Smokeless tobacco: Never Used  Vaping Use  . Vaping Use: Never used  Substance Use Topics  . Alcohol use: No  . Drug use: No    Prior to Admission medications   Medication Sig Start Date End Date Taking? Authorizing Provider  doxycycline (VIBRAMYCIN) 100 MG capsule Take 1 capsule (100 mg total) by mouth 2 (two) times daily. One po bid x 7 days 01/19/20   Arneisha Kincannon, MD  Multiple Vitamin (MULTIVITAMIN WITH MINERALS) TABS tablet Take 1  tablet by mouth daily with breakfast.    [provider]  naproxen sodium (ALEVE) 220 MG tablet Take 220-440 mg by mouth 2 (two) times daily as needed (pain.).    [provider]  verapamil (CALAN-SR) 120 MG CR tablet Take 1 tablet (120 mg total) by mouth daily. 01/12/20   Sheilah Pigeon, PA-C    Allergies Patient has no known allergies.   REVIEW OF SYSTEMS  Negative except as noted here or in the History of Present Illness.   PHYSICAL EXAMINATION  Initial Vital Signs Blood pressure 122/84, pulse 84, temperature 98.6 F (37 C), resp. rate 15, height 6\' 1"  (1.854 m), weight 127 kg, SpO2 100 %.  Examination General: Well-developed, well-nourished male in no acute distress; appearance consistent with age of record HENT: normocephalic; atraumatic Eyes: Normal appearance Neck: supple Heart: regular rate and rhythm Lungs: clear to auscultation bilaterally Abdomen: soft; nondistended; nontender; bowel sounds present Extremities: No deformity; full range of motion; pulses normal Neurologic: Awake, alert and oriented; motor function intact in all extremities and symmetric; no facial droop Skin: Warm and dry; 3 femoral puncture wounds without signs of infection, nontender to palpation Psychiatric: Normal mood and affect   RESULTS  Summary of this visit's results, reviewed and interpreted by myself:   EKG Interpretation  Date/Time:    Ventricular Rate:    PR Interval:    QRS Duration:   QT Interval:    QTC Calculation:  R Axis:     Text Interpretation:        Laboratory Studies: No results found for this or any previous visit (from the past 24 hour(s)). Imaging Studies: No results found.  ED COURSE and MDM  Nursing notes, initial and subsequent vitals signs, including pulse oximetry, reviewed and interpreted by myself.  Vitals:   01/19/20 0102 01/19/20 0103  BP:  122/84  Pulse:  84  Resp:  15  Temp:  98.6 F (37 C)  SpO2: 100% 100%  Weight:   127 kg  Height:  6\' 1"  (1.854 m)   Medications  doxycycline (VIBRA-TABS) tablet 100 mg (has no administration in time range)    No obvious signs of infection on exam but we will go ahead and treat with doxycycline as a precaution.  PROCEDURES  Procedures   ED DIAGNOSES     ICD-10-CM   1. Visit for wound check  Z51.89   2. Status post cardiac catheterization  Z98.890        10-06-1983, MD 01/19/20 [phone]

## 2020-01-26 DIAGNOSIS — Z0279 Encounter for issue of other medical certificate: Secondary | ICD-10-CM

## 2020-02-03 ENCOUNTER — Telehealth: Payer: Self-pay | Admitting: Cardiology

## 2020-02-03 MED ORDER — VERAPAMIL HCL ER 120 MG PO TBCR
120.0000 mg | EXTENDED_RELEASE_TABLET | Freq: Every day | ORAL | 3 refills | Status: DC
Start: 2020-02-03 — End: 2020-02-11

## 2020-02-03 NOTE — Telephone Encounter (Signed)
*  STAT* If patient is at the pharmacy, call can be transferred to refill team.   1. Which medications need to be refilled? (please list name of each medication and dose if known) Verapamil  2. Which pharmacy/location (including street and city if local pharmacy) is medication to be sent to?  [facility] [address], [place]  3. Do they need a 30 day or 90 day supply? 90 days and refills

## 2020-02-03 NOTE — Telephone Encounter (Signed)
Pt's medication was sent to pt's pharmacy as requested. Confirmation received.  °

## 2020-02-08 ENCOUNTER — Other Ambulatory Visit: Payer: Self-pay

## 2020-02-08 ENCOUNTER — Ambulatory Visit (INDEPENDENT_AMBULATORY_CARE_PROVIDER_SITE_OTHER): Payer: BC Managed Care – PPO | Admitting: Cardiology

## 2020-02-08 ENCOUNTER — Encounter: Payer: Self-pay | Admitting: Cardiology

## 2020-02-08 VITALS — BP 128/88 | HR 71 | Ht 73.0 in | Wt 294.2 lb

## 2020-02-08 DIAGNOSIS — I472 Ventricular tachycardia, unspecified: Secondary | ICD-10-CM

## 2020-02-08 NOTE — Patient Instructions (Addendum)
Medication Instructions:  Your physician recommends that you continue on your current medications as directed. Please refer to the Current Medication list given to you today.  Labwork: None ordered.  Testing/Procedures: None ordered.  Follow-Up: Your physician wants you to follow-up in: 6 months with Dr. Lambert.   You will receive a reminder letter in the mail two months in advance. If you don't receive a letter, please call our office to schedule the follow-up appointment.   Any Other Special Instructions Will Be Listed Below (If Applicable).  If you need a refill on your cardiac medications before your next appointment, please call your pharmacy.   

## 2020-02-08 NOTE — Progress Notes (Signed)
Electrophysiology Office Follow up Visit Note:    Date:  02/08/2020   ID:  Xavier Johnston, DOB June 05, 1975, MRN [id number]  PCP:  Xavier Deed, PA  [facility] Cardiologist:  No primary care provider on file.  [facility] Electrophysiologist:  Lanier Prude, MD    Interval History:    Xavier Johnston is a 44 y.o. male who presents for a follow up visit after EP study on January 08, 2020, heart catheterization on October 25 and loop recorder implantation on October 26.  On last device remote transmission, no episodes of tachycardia.  He has been doing well on verapamil 120 mg daily. He tells me that several days after he was sent home from the hospital after his heart catheterization he did have a little bit of drainage from his right femoral access sites.  He went to the ER and was evaluated.  They were not concerned for infection but did prescribe him a brief course of doxycycline.  He said the sites have healed up well.  The loop site is also well-healed.   Past Medical History:  Diagnosis Date  . Ventricular tachycardia [facility])     Past Surgical History:  Procedure Laterality Date  . LEFT HEART CATH AND CORONARY ANGIOGRAPHY N/A 01/11/2020   Procedure: LEFT HEART CATH AND CORONARY ANGIOGRAPHY;  Surgeon: Lyn Records, MD;  Location: [facility] INVASIVE CV LAB;  Service: Cardiovascular;  Laterality: N/A;  . LOOP RECORDER INSERTION N/A 01/12/2020   Procedure: LOOP RECORDER INSERTION;  Surgeon: Lanier Prude, MD;  Location: [facility] INVASIVE CV LAB;  Service: Cardiovascular;  Laterality: N/A;  . SVT ABLATION N/A 01/08/2020   Procedure: SVT ABLATION;  Surgeon: Lanier Prude, MD;  Location: [facility] INVASIVE CV LAB;  Service: Cardiovascular;  Laterality: N/A;  . vastectomy      Current Medications: Current Meds  Medication Sig  . Multiple Vitamin (MULTIVITAMIN WITH MINERALS) TABS tablet Take 1 tablet by mouth daily with breakfast.  . naproxen sodium (ALEVE) 220 MG tablet Take  220-440 mg by mouth 2 (two) times daily as needed (pain.).  Marland Kitchen verapamil (CALAN-SR) 120 MG CR tablet Take 1 tablet (120 mg total) by mouth daily.     Allergies:   Patient has no known allergies.   Social History   Socioeconomic History  . Marital status: Married    Spouse name: Not on file  . Number of children: Not on file  . Years of education: Not on file  . Highest education level: Not on file  Occupational History  . Not on file  Tobacco Use  . Smoking status: Never Smoker  . Smokeless tobacco: Never Used  Vaping Use  . Vaping Use: Never used  Substance and Sexual Activity  . Alcohol use: No  . Drug use: No  . Sexual activity: Yes  Other Topics Concern  . Not on file  Social History Narrative  . Not on file   Social Determinants of Health   Financial Resource Strain:   . Difficulty of Paying Living Expenses: Not on file  Food Insecurity:   . Worried About Programme researcher, broadcasting/film/video in the Last Year: Not on file  . Ran Out of Food in the Last Year: Not on file  Transportation Needs:   . Lack of Transportation (Medical): Not on file  . Lack of Transportation (Non-Medical): Not on file  Physical Activity:   . Days of Exercise per Week: Not on file  . Minutes of Exercise per Session:  Not on file  Stress:   . Feeling of Stress : Not on file  Social Connections:   . Frequency of Communication with Friends and Family: Not on file  . Frequency of Social Gatherings with Friends and Family: Not on file  . Attends Religious Services: Not on file  . Active Member of Clubs or Organizations: Not on file  . Attends Banker Meetings: Not on file  . Marital Status: Not on file     Family History: The patient's family history includes Cancer in his paternal grandmother; Diabetes in his maternal grandmother; Heart attack in his father; Sudden Cardiac Death in his father.  ROS:   Please see the history of present illness.    All other systems reviewed and are  negative.  EKGs/Labs/Other Studies Reviewed:    The following studies were reviewed today: EP study, heart catheterization, cardiac MRI  January 11, 2020 cardiac MRI IMPRESSION: 1.  Normal LV size, mild LVH, and normal systolic function (EF 63%) 2.  Normal RV size and systolic function (EF 51%) 3. RV insertion site late gadolinium enhancement, which is a nonspecific finding often seen in setting of elevated pulmonary pressures. No other LGE seen  January 11, 2020 left heart catheterization personally reviewed  Left coronary anatomy.  Normal left main  Normal LAD that wraps around the left ventricular apex.  Normal dominant circumflex.  Nondominant right coronary.  Normal left ventricular systolic function with EF 55%.  LVEDP 18 mmHg.     EKG:  The ekg ordered today demonstrates sinus rhythm  Recent Labs: 10/23/2019: Magnesium 2.3; TSH 1.964 01/09/2020: BUN 11; Creatinine, Ser 0.93; Hemoglobin 12.6; Platelets 265; Potassium 4.0; Sodium 140  Recent Lipid Panel    Component Value Date/Time   CHOL 190 05/12/2014 1333   TRIG 93 05/12/2014 1333   HDL 37 (L) 05/12/2014 1333   CHOLHDL 5.1 05/12/2014 1333   VLDL 19 05/12/2014 1333   LDLCALC 134 (H) 05/12/2014 1333    Physical Exam:    VS:  BP 128/88   Pulse 71   Ht 6\' 1"  (1.854 m)   Wt 294 lb 3.2 oz (133.4 kg)   SpO2 97%   BMI 38.82 kg/m     Wt Readings from Last 3 Encounters:  02/08/20 294 lb 3.2 oz (133.4 kg)  01/19/20 280 lb (127 kg)  01/12/20 286 lb 14.4 oz (130.1 kg)     GEN:  Well nourished, well developed in no acute distress HEENT: Normal NECK: No JVD; No carotid bruits LYMPHATICS: No lymphadenopathy CARDIAC: RRR, no murmurs, rubs, gallops RESPIRATORY:  Clear to auscultation without rales, wheezing or rhonchi  ABDOMEN: Soft, non-tender, non-distended MUSCULOSKELETAL:  No edema; No deformity  SKIN: Warm and dry NEUROLOGIC:  Alert and oriented x 3 PSYCHIATRIC:  Normal affect   ASSESSMENT:     1. Ventricular tachycardia (HCC)    PLAN:    In order of problems listed above:  1. Ventricular tachycardia And a structurally normal heart based on cardiac MRI, echo and left heart catheterization.  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  Unfortunately, I was unable to initiate the tachycardia for electrical testing during the EP study.  He was started on verapamil and has been doing well since discharge. Plan to continue on verapamil 120 mg once daily for now.  We will plan on seeing him back in 6 months.   Medication Adjustments/Labs and Tests Ordered: Current medicines are reviewed at length with the patient  today.  Concerns regarding medicines are outlined above.  Orders Placed This Encounter  Procedures  . EKG 12-Lead   No orders of the defined types were placed in this encounter.    Signed, Steffanie Dunn, MD, [facility]  02/08/2020 10:21 AM    Electrophysiology [facility]

## 2020-02-10 ENCOUNTER — Observation Stay (HOSPITAL_BASED_OUTPATIENT_CLINIC_OR_DEPARTMENT_OTHER)
Admission: EM | Admit: 2020-02-10 | Discharge: 2020-02-11 | Disposition: A | Discharge status: Home / Self Care | Payer: BC Managed Care – PPO | Attending: Cardiology | Admitting: Cardiology

## 2020-02-10 ENCOUNTER — Other Ambulatory Visit: Payer: Self-pay

## 2020-02-10 ENCOUNTER — Encounter (HOSPITAL_BASED_OUTPATIENT_CLINIC_OR_DEPARTMENT_OTHER): Payer: Self-pay | Admitting: Emergency Medicine

## 2020-02-10 DIAGNOSIS — Z20822 Contact with and (suspected) exposure to covid-19: Secondary | ICD-10-CM | POA: Diagnosis not present

## 2020-02-10 DIAGNOSIS — I472 Ventricular tachycardia, unspecified: Secondary | ICD-10-CM

## 2020-02-10 DIAGNOSIS — R002 Palpitations: Secondary | ICD-10-CM | POA: Diagnosis present

## 2020-02-10 DIAGNOSIS — Z79899 Other long term (current) drug therapy: Secondary | ICD-10-CM | POA: Insufficient documentation

## 2020-02-10 DIAGNOSIS — Z9861 Coronary angioplasty status: Secondary | ICD-10-CM | POA: Diagnosis not present

## 2020-02-10 MED ORDER — METOPROLOL TARTRATE 5 MG/5ML IV SOLN
INTRAVENOUS | Status: AC
  Administered 2020-02-11: 5 mg via INTRAVENOUS
  Filled 2020-02-10: qty 5

## 2020-02-10 MED ORDER — DILTIAZEM HCL 25 MG/5ML IV SOLN
INTRAVENOUS | Status: AC
  Filled 2020-02-10: qty 5

## 2020-02-10 NOTE — ED Triage Notes (Signed)
Patient presents with complaints of tachycardia; states onset 45 minutes pta. States some shortness of breath; denies dizziness.

## 2020-02-10 NOTE — ED Notes (Signed)
ED Provider at bedside. 

## 2020-02-11 DIAGNOSIS — R002 Palpitations: Secondary | ICD-10-CM

## 2020-02-11 DIAGNOSIS — I472 Ventricular tachycardia, unspecified: Secondary | ICD-10-CM

## 2020-02-11 LAB — CBC WITH DIFFERENTIAL/PLATELET
Abs Immature Granulocytes: 0.01 10*3/uL (ref 0.00–0.07)
Basophils Absolute: 0 10*3/uL (ref 0.0–0.1)
Basophils Relative: 1 %
Eosinophils Absolute: 0.2 10*3/uL (ref 0.0–0.5)
Eosinophils Relative: 2 %
HCT: 41.4 % (ref 39.0–52.0)
Hemoglobin: 13.5 g/dL (ref 13.0–17.0)
Immature Granulocytes: 0 %
Lymphocytes Relative: 43 %
Lymphs Abs: 3.6 10*3/uL (ref 0.7–4.0)
MCH: 27.2 pg (ref 26.0–34.0)
MCHC: 32.6 g/dL (ref 30.0–36.0)
MCV: 83.5 fL (ref 80.0–100.0)
Monocytes Absolute: 0.7 10*3/uL (ref 0.1–1.0)
Monocytes Relative: 8 %
Neutro Abs: 4 10*3/uL (ref 1.7–7.7)
Neutrophils Relative %: 46 %
Platelets: 295 10*3/uL (ref 150–400)
RBC: 4.96 MIL/uL (ref 4.22–5.81)
RDW: 13.8 % (ref 11.5–15.5)
WBC: 8.5 10*3/uL (ref 4.0–10.5)
nRBC: 0 % (ref 0.0–0.2)

## 2020-02-11 LAB — CBC
HCT: 40.3 % (ref 39.0–52.0)
Hemoglobin: 12.9 g/dL — ABNORMAL LOW (ref 13.0–17.0)
MCH: 26.7 pg (ref 26.0–34.0)
MCHC: 32 g/dL (ref 30.0–36.0)
MCV: 83.4 fL (ref 80.0–100.0)
Platelets: 269 10*3/uL (ref 150–400)
RBC: 4.83 MIL/uL (ref 4.22–5.81)
RDW: 13.6 % (ref 11.5–15.5)
WBC: 9.4 10*3/uL (ref 4.0–10.5)
nRBC: 0 % (ref 0.0–0.2)

## 2020-02-11 LAB — MAGNESIUM: Magnesium: 2.3 mg/dL (ref 1.7–2.4)

## 2020-02-11 LAB — BASIC METABOLIC PANEL
Anion gap: 10 (ref 5–15)
Anion gap: 7 (ref 5–15)
BUN: 16 mg/dL (ref 6–20)
BUN: 13 mg/dL (ref 6–20)
CO2: 25 mmol/L (ref 22–32)
CO2: 27 mmol/L (ref 22–32)
Calcium: 9.5 mg/dL (ref 8.9–10.3)
Calcium: 9.4 mg/dL (ref 8.9–10.3)
Chloride: 102 mmol/L (ref 98–111)
Chloride: 106 mmol/L (ref 98–111)
Creatinine, Ser: 0.98 mg/dL (ref 0.61–1.24)
Creatinine, Ser: 0.92 mg/dL (ref 0.61–1.24)
GFR, Estimated: >60 mL/min (ref >60)
GFR, Estimated: >60 mL/min (ref >60)
Glucose, Bld: 182 mg/dL — ABNORMAL HIGH (ref 70–99)
Glucose, Bld: 153 mg/dL — ABNORMAL HIGH (ref 70–99)
Potassium: 3.7 mmol/L (ref 3.5–5.1)
Potassium: 4.2 mmol/L (ref 3.5–5.1)
Sodium: 137 mmol/L (ref 135–145)
Sodium: 140 mmol/L (ref 135–145)

## 2020-02-11 LAB — RESP PANEL BY RT-PCR (FLU A&B, COVID) ARPGX2
Influenza A by PCR: NEGATIVE
Influenza B by PCR: NEGATIVE
SARS Coronavirus 2 by RT PCR: NEGATIVE

## 2020-02-11 MED ORDER — POTASSIUM CHLORIDE CRYS ER 10 MEQ PO TBCR
40.0000 meq | EXTENDED_RELEASE_TABLET | Freq: Once | ORAL | Status: AC
  Administered 2020-02-11: 40 meq via ORAL
  Filled 2020-02-11: qty 4

## 2020-02-11 MED ORDER — AMIODARONE IV BOLUS ONLY 150 MG/100ML
INTRAVENOUS | Status: AC
  Filled 2020-02-11: qty 100

## 2020-02-11 MED ORDER — HEPARIN SODIUM (PORCINE) 5000 UNIT/ML IJ SOLN
5000.0000 [IU] | Freq: Three times a day (TID) | INTRAMUSCULAR | Status: DC
  Administered 2020-02-11: 5000 [IU] via SUBCUTANEOUS
  Filled 2020-02-11: qty 1

## 2020-02-11 MED ORDER — VERAPAMIL HCL ER 240 MG PO TBCR
240.0000 mg | EXTENDED_RELEASE_TABLET | Freq: Every day | ORAL | Status: DC
  Administered 2020-02-11: 240 mg via ORAL
  Filled 2020-02-11: qty 1

## 2020-02-11 MED ORDER — AMIODARONE HCL IN DEXTROSE 360-4.14 MG/200ML-% IV SOLN
30.0000 mg/h | INTRAVENOUS | Status: DC
  Administered 2020-02-11: 30 mg/h via INTRAVENOUS
  Filled 2020-02-11 (×2): qty 200

## 2020-02-11 MED ORDER — VERAPAMIL HCL ER 120 MG PO TBCR
120.0000 mg | EXTENDED_RELEASE_TABLET | Freq: Every day | ORAL | Status: DC
  Filled 2020-02-11: qty 1

## 2020-02-11 MED ORDER — VERAPAMIL HCL ER 240 MG PO TBCR: 240.0000 mg | EXTENDED_RELEASE_TABLET | Freq: Every day | ORAL | 2 refills | Status: DC

## 2020-02-11 MED ORDER — VERAPAMIL HCL ER 240 MG PO TBCR
240.0000 mg | EXTENDED_RELEASE_TABLET | Freq: Every day | ORAL | Status: DC
  Filled 2020-02-11: qty 1

## 2020-02-11 MED ORDER — AMIODARONE HCL IN DEXTROSE 360-4.14 MG/200ML-% IV SOLN
60.0000 mg/h | INTRAVENOUS | Status: DC
  Administered 2020-02-11: 60 mg/h via INTRAVENOUS

## 2020-02-11 MED ORDER — AMIODARONE LOAD VIA INFUSION
150.0000 mg | Freq: Once | INTRAVENOUS | Status: AC
  Administered 2020-02-11: 150 mg via INTRAVENOUS
  Filled 2020-02-11: qty 83.34

## 2020-02-11 MED ORDER — METOPROLOL SUCCINATE ER 25 MG PO TB24
25.0000 mg | ORAL_TABLET | Freq: Every day | ORAL | Status: DC
  Administered 2020-02-11: 25 mg via ORAL
  Filled 2020-02-11: qty 1

## 2020-02-11 MED ORDER — METOPROLOL TARTRATE 5 MG/5ML IV SOLN: 5.0000 mg | Freq: Once | INTRAVENOUS | Status: AC

## 2020-02-11 MED ORDER — METOPROLOL SUCCINATE ER 25 MG PO TB24: 25.0000 mg | ORAL_TABLET | Freq: Every day | ORAL | 2 refills | Status: DC

## 2020-02-11 NOTE — Plan of Care (Signed)
  Problem: Education: Goal: Knowledge of General Education information will improve Description: Including pain rating scale, medication(s)/side effects and non-pharmacologic comfort measures 02/11/2020 0931 by Janece Canterbury, RN Outcome: Adequate for Discharge 02/11/2020 0931 by Janece Canterbury, RN Outcome: Adequate for Discharge   Problem: Health Behavior/Discharge Planning: Goal: Ability to manage health-related needs will improve 02/11/2020 0931 by Janece Canterbury, RN Outcome: Adequate for Discharge 02/11/2020 0931 by Janece Canterbury, RN Outcome: Adequate for Discharge   Problem: Clinical Measurements: Goal: Ability to maintain clinical measurements within normal limits will improve 02/11/2020 0931 by Janece Canterbury, RN Outcome: Adequate for Discharge 02/11/2020 0931 by Janece Canterbury, RN Outcome: Adequate for Discharge Goal: Will remain free from infection 02/11/2020 0931 by Janece Canterbury, RN Outcome: Adequate for Discharge 02/11/2020 0931 by Janece Canterbury, RN Outcome: Adequate for Discharge Goal: Diagnostic test results will improve 02/11/2020 0931 by Janece Canterbury, RN Outcome: Adequate for Discharge 02/11/2020 0931 by Janece Canterbury, RN Outcome: Adequate for Discharge Goal: Respiratory complications will improve 02/11/2020 0931 by Janece Canterbury, RN Outcome: Adequate for Discharge 02/11/2020 0931 by Janece Canterbury, RN Outcome: Adequate for Discharge Goal: Cardiovascular complication will be avoided 02/11/2020 0931 by Janece Canterbury, RN Outcome: Adequate for Discharge 02/11/2020 0931 by Janece Canterbury, RN Outcome: Adequate for Discharge   Problem: Activity: Goal: Risk for activity intolerance will decrease 02/11/2020 0931 by Janece Canterbury, RN Outcome: Adequate for Discharge 02/11/2020 0931 by Janece Canterbury, RN Outcome: Adequate for Discharge   Problem: Nutrition: Goal: Adequate nutrition will be maintained 02/11/2020 0931 by Janece Canterbury, RN Outcome: Adequate for Discharge 02/11/2020 0931 by Janece Canterbury,  RN Outcome: Adequate for Discharge   Problem: Coping: Goal: Level of anxiety will decrease 02/11/2020 0931 by Janece Canterbury, RN Outcome: Adequate for Discharge 02/11/2020 0931 by Janece Canterbury, RN Outcome: Adequate for Discharge   Problem: Elimination: Goal: Will not experience complications related to bowel motility 02/11/2020 0931 by Janece Canterbury, RN Outcome: Adequate for Discharge 02/11/2020 0931 by Janece Canterbury, RN Outcome: Adequate for Discharge Goal: Will not experience complications related to urinary retention 02/11/2020 0931 by Janece Canterbury, RN Outcome: Adequate for Discharge 02/11/2020 0931 by Janece Canterbury, RN Outcome: Adequate for Discharge   Problem: Pain Managment: Goal: General experience of comfort will improve 02/11/2020 0931 by Janece Canterbury, RN Outcome: Adequate for Discharge 02/11/2020 0931 by Janece Canterbury, RN Outcome: Adequate for Discharge   Problem: Safety: Goal: Ability to remain free from injury will improve 02/11/2020 0931 by Janece Canterbury, RN Outcome: Adequate for Discharge 02/11/2020 0931 by Janece Canterbury, RN Outcome: Adequate for Discharge   Problem: Skin Integrity: Goal: Risk for impaired skin integrity will decrease 02/11/2020 0931 by Janece Canterbury, RN Outcome: Adequate for Discharge 02/11/2020 0931 by Janece Canterbury, RN Outcome: Adequate for Discharge

## 2020-02-11 NOTE — ED Provider Notes (Signed)
[facility] EMERGENCY DEPARTMENT Provider Note   CSN: [id number] Arrival date & time: 02/10/20  2253     History Chief Complaint  Patient presents with  . Tachycardia    Xavier Johnston is a 44 y.o. male.  The history is provided by the patient.  Palpitations Palpitations quality:  Fast Onset quality:  Sudden Duration:  30 minutes Timing:  Constant Progression:  Worsening Chronicity:  New Relieved by:  Nothing Worsened by:  Nothing Associated symptoms: chest pressure, diaphoresis and shortness of breath   Associated symptoms: no syncope and no vomiting   Patient w/ previous history of ventricular tachycardia presents with palpitations.  Patient reports he just got back from the grocery store when he noticed his heart was racing and felt lightheaded.  He also reported shortness of breath & diaphoresis.  He has had similar episodes previously.  He reports medication compliance with verapamil.  Denies drug abuse.  He does report 1 glass of wine tonight     Past Medical History:  Diagnosis Date  . Ventricular tachycardia [facility])     Patient Active Problem List   Diagnosis Date Noted  . Ventricular tachycardia (HCC) 01/08/2020  . 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 RBBB   . Demand ischemia (HCC)   . Hyperlipidemia 05/13/2014    Past Surgical History:  Procedure Laterality Date  . LEFT HEART CATH AND CORONARY ANGIOGRAPHY N/A 01/11/2020   Procedure: LEFT HEART CATH AND CORONARY ANGIOGRAPHY;  Surgeon: Lyn Records, MD;  Location: [facility] INVASIVE CV LAB;  Service: Cardiovascular;  Laterality: N/A;  . LOOP RECORDER INSERTION N/A 01/12/2020   Procedure: LOOP RECORDER INSERTION;  Surgeon: Lanier Prude, MD;  Location: [facility] INVASIVE CV LAB;  Service: Cardiovascular;  Laterality: N/A;  . SVT ABLATION N/A 01/08/2020   Procedure: SVT ABLATION;  Surgeon: Lanier Prude, MD;  Location: [facility] INVASIVE CV LAB;  Service: Cardiovascular;  Laterality: N/A;  .  vastectomy         Family History  Problem Relation Age of Onset  . Heart attack Father   . Sudden Cardiac Death Father   . Diabetes Maternal Grandmother   . Cancer Paternal Grandmother     Social History   Tobacco Use  . Smoking status: Never Smoker  . Smokeless tobacco: Never Used  Vaping Use  . Vaping Use: Never used  Substance Use Topics  . Alcohol use: No  . Drug use: No    Home Medications Prior to Admission medications   Medication Sig Start Date End Date Taking? Authorizing Provider  Multiple Vitamin (MULTIVITAMIN WITH MINERALS) TABS tablet Take 1 tablet by mouth daily with breakfast.    [provider]  naproxen sodium (ALEVE) 220 MG tablet Take 220-440 mg by mouth 2 (two) times daily as needed (pain.).    [provider]  verapamil (CALAN-SR) 120 MG CR tablet Take 1 tablet (120 mg total) by mouth daily. 02/03/20   Lanier Prude, MD    Allergies    Patient has no known allergies.  Review of Systems   Review of Systems  Constitutional: Positive for diaphoresis.  Respiratory: Positive for shortness of breath.   Cardiovascular: Positive for palpitations. Negative for syncope.  Gastrointestinal: Negative for vomiting.  Neurological: Negative for syncope.  All other systems reviewed and are negative.   Physical Exam Updated Vital Signs BP 113/82   Pulse 80   Temp 98.9 F (37.2 C) (Oral)   Resp 20   Ht 1.854 m (6'  1")   Wt 133 kg   SpO2 99%   BMI 38.68 kg/m   Physical Exam CONSTITUTIONAL: Well developed/well nourished HEAD: Normocephalic/atraumatic EYES: EOMI/PERRL ENMT: Mucous membranes moist NECK: supple no meningeal signs SPINE/BACK:entire spine nontender CV: S1/S2 noted, no murmurs/rubs/gallops noted LUNGS: Lungs are clear to auscultation bilaterally, no apparent distress ABDOMEN: soft, nontender NEURO: Pt is awake/alert/appropriate, moves all extremitiesx4.  No facial droop.   EXTREMITIES: pulses normal/equal, full  ROM SKIN: warm, color normal PSYCH: no abnormalities of mood noted, alert and oriented to situation  ED Results / Procedures / Treatments   Labs (all labs ordered are listed, but only abnormal results are displayed) Labs Reviewed  BASIC METABOLIC PANEL - Abnormal; Notable for the following components:      Result Value   Glucose, Bld 182 (*)    All other components within normal limits  RESP PANEL BY RT-PCR (FLU A&B, COVID) ARPGX2  CBC WITH DIFFERENTIAL/PLATELET  MAGNESIUM    EKG EKG Interpretation  Date/Time:  Wednesday February 10 2020 23:13:01 EST Ventricular Rate:  205 PR Interval:    QRS Duration: 157 QT Interval:  256 QTC Calculation: 473 R Axis:   175 Text Interpretation: wide complex tachycardia Confirmed by Zadie Rhine ([id number]) on 02/11/2020 12:06:32 AM   EKG Interpretation  Date/Time:  Wednesday February 10 2020 23:19:47 EST Ventricular Rate:  114 PR Interval:    QRS Duration: 122 QT Interval:  323 QTC Calculation: 445 R Axis:   -82 Text Interpretation: Sinus tachycardia RBBB and LAFB Confirmed by Zadie Rhine ([id number]) on 02/11/2020 12:07:13 AM       ED ECG REPORT   Date: 02/11/2020 0031  Rate: 87  Rhythm: normal sinus rhythm  QRS Axis: normal  Intervals: normal  ST/T Wave abnormalities: normal  Conduction Disutrbances:right bundle branch block  Narrative Interpretation:   Old EKG Reviewed: unchanged  I have personally reviewed the EKG tracing and agree with the computerized printout as noted.  Radiology No results found.  Procedures .Critical Care Performed by: Zadie Rhine, MD Authorized by: Zadie Rhine, MD   Critical care provider statement:    Critical care time (minutes):  50   Critical care start time:  02/11/2020 12:10 AM   Critical care end time:  02/11/2020 1:00 AM   Critical care time was exclusive of:  Separately billable procedures and treating other patients   Critical care was necessary to treat or prevent  imminent or life-threatening deterioration of the following conditions:  Cardiac failure   Critical care was time spent personally by me on the following activities:  Pulse oximetry, ordering and review of laboratory studies, examination of patient, discussions with consultants, development of treatment plan with patient or surrogate, evaluation of patient's response to treatment, obtaining history from patient or surrogate, review of old charts, re-evaluation of patient's condition and ordering and performing treatments and interventions   I assumed direction of critical care for this patient from another provider in my specialty: no      Medications Ordered in ED Medications  amiodarone (NEXTERONE PREMIX) 360-4.14 MG/200ML-% (1.8 mg/mL) IV infusion (60 mg/hr Intravenous New Bag/Given 02/11/20 0010)    Followed by  amiodarone (NEXTERONE PREMIX) 360-4.14 MG/200ML-% (1.8 mg/mL) IV infusion (has no administration in time range)  amiodarone (NEXTERONE) 150-4.21 MG/100ML-% bolus (  Canceled Entry 02/11/20 0022)  amiodarone (NEXTERONE) 1.8 mg/mL load via infusion 150 mg (150 mg Intravenous Bolus from Bag 02/11/20 0017)  metoprolol tartrate (LOPRESSOR) injection 5 mg (5 mg Intravenous Given 02/11/20 0000)  ED Course  I have reviewed the triage vital signs and the nursing notes.  Pertinent labs results that were available during my care of the patient were reviewed by me and considered in my medical decision making (see chart for details).    MDM Rules/Calculators/A&P                          12:11 AM Patient with previous history of ventricular tachycardia presents with palpitations.  Initial EKG showed a heart rate over 200.  By the time of my initial evaluation his heart rate had improved after vagal maneuvers.  Patient had recent extensive work-up which showed no coronary artery disease, and no structural defects to the heart. He was placed on verapamil by his cardiologist.  I consulted  cardiology fellow Dr. Brayton Layman.  While we were speaking patient went back into a heart rate of 200.  Dr. Lily Peer requested 1 dose of Lopressor 5 mg IV, and if this does not work try amiodarone. Patient will be admitted to [facility]. Patient had no response to Lopressor, I have ordered amiodarone 1:00 AM Patient is improved.  He is now back in a sinus rhythm with heart rate in the 80s.  Blood pressure is appropriate.  He is responded well to amiodarone He is awake and alert this time. He is awaiting transfer to [facility].  Discussed case with patient and his wife via phone.  Final Clinical Impression(s) / ED Diagnoses Final diagnoses:  Ventricular tachycardia [facility])    Rx / DC Orders ED Discharge Orders    None       Zadie Rhine, MD 02/11/20 0101

## 2020-02-11 NOTE — Progress Notes (Signed)
Patient is alert and oriented x 4. Went over discharge instructions with patient and patient verbalize understanding of medication changes and what and where to pick up the remaining medication tomorrow. PIV, tele monitor and zoll pads has been removed and CCMD has been notified about the patient's discharge. Transportation has been called and notified; patient is currently is getting dressed.

## 2020-02-11 NOTE — H&P (View-Only) (Signed)
Cardiology Consultation:   Patient ID: Xavier Johnston MRN: [id number]; DOB: [birth year]  Admit date: 02/10/2020 Date of Consult: 02/11/2020  Primary Care Provider: Maud Deed, PA  [facility] Electrophysiologist:  Lanier Prude, MD     History of Present Illness:   Xavier Johnston with VT and a structurally normal heart presented to the ER yesterday evening after an episode of tachycardia. Found to be in the same wide complex tachycardia that he experienced several months ago. He was putting a box in the garbage can and experienced a strange sensation. He went to sit down and then felt the heart rate increase. He was not diaphoretic or lightheaded. He was transported to [facility] where the rhythm stopped with vagal maneuvers. While in the ER he experienced a second episode of the rhythm. This time he felt worse and he was diaphoretic. No presyncope or syncope.  I have followed him in the outpatient setting. I did an EP study on 01/08/2020 during which the VT was only transiently induced and not enough to be studied or ablated.  He had a cardiac MRI which showed a structurally normal heart without scar. Cardiac cath was normal. Loop was implanted.  I last saw him on 02/08/2020. At that appointment he was doing well on verapamil. The plan at that appointment was to follow up in 6 months.  In the ER last night he was loaded with amiodarone which terminated the second episode of VT.   This morning discussed his case with the patient and his wife via phone.   Past Medical History:  Diagnosis Date  . Ventricular tachycardia [facility])     Past Surgical History:  Procedure Laterality Date  . LEFT HEART CATH AND CORONARY ANGIOGRAPHY N/A 01/11/2020   Procedure: LEFT HEART CATH AND CORONARY ANGIOGRAPHY;  Surgeon: Lyn Records, MD;  Location: [facility] INVASIVE CV LAB;  Service: Cardiovascular;  Laterality: N/A;  . LOOP RECORDER INSERTION N/A 01/12/2020   Procedure: LOOP RECORDER INSERTION;   Surgeon: Lanier Prude, MD;  Location: [facility] INVASIVE CV LAB;  Service: Cardiovascular;  Laterality: N/A;  . SVT ABLATION N/A 01/08/2020   Procedure: SVT ABLATION;  Surgeon: Lanier Prude, MD;  Location: [facility] INVASIVE CV LAB;  Service: Cardiovascular;  Laterality: N/A;  . vastectomy       Home Medications:  Prior to Admission medications   Medication Sig Start Date End Date Taking? Authorizing Provider  Multiple Vitamin (MULTIVITAMIN WITH MINERALS) TABS tablet Take 1 tablet by mouth daily with breakfast.    [provider]  naproxen sodium (ALEVE) 220 MG tablet Take 220-440 mg by mouth 2 (two) times daily as needed (pain.).    [provider]  verapamil (CALAN-SR) 120 MG CR tablet Take 1 tablet (120 mg total) by mouth daily. 02/03/20   Lanier Prude, MD    Inpatient Medications: Scheduled Meds: . heparin  5,000 Units Subcutaneous Q8H  . metoprolol succinate  25 mg Oral Daily  . verapamil  240 mg Oral Daily   Continuous Infusions: . amiodarone     PRN Meds:   Allergies:   No Known Allergies  Social History:   Social History   Socioeconomic History  . Marital status: Married    Spouse name: Not on file  . Number of children: Not on file  . Years of education: Not on file  . Highest education level: Not on file  Occupational History  . Not on file  Tobacco Use  . Smoking status: Never  Smoker  . Smokeless tobacco: Never Used  Vaping Use  . Vaping Use: Never used  Substance and Sexual Activity  . Alcohol use: No  . Drug use: No  . Sexual activity: Yes  Other Topics Concern  . Not on file  Social History Narrative  . Not on file   Social Determinants of Health   Financial Resource Strain:   . Difficulty of Paying Living Expenses: Not on file  Food Insecurity:   . Worried About Programme researcher, broadcasting/film/video in the Last Year: Not on file  . Ran Out of Food in the Last Year: Not on file  Transportation Needs:   . Lack of Transportation (Medical):  Not on file  . Lack of Transportation (Non-Medical): Not on file  Physical Activity:   . Days of Exercise per Week: Not on file  . Minutes of Exercise per Session: Not on file  Stress:   . Feeling of Stress : Not on file  Social Connections:   . Frequency of Communication with Friends and Family: Not on file  . Frequency of Social Gatherings with Friends and Family: Not on file  . Attends Religious Services: Not on file  . Active Member of Clubs or Organizations: Not on file  . Attends Banker Meetings: Not on file  . Marital Status: Not on file  Intimate Partner Violence:   . Fear of Current or Ex-Partner: Not on file  . Emotionally Abused: Not on file  . Physically Abused: Not on file  . Sexually Abused: Not on file    Family History:    Family History  Problem Relation Age of Onset  . Heart attack Father   . Sudden Cardiac Death Father   . Diabetes Maternal Grandmother   . Cancer Paternal Grandmother      ROS:  Please see the history of present illness.   All other ROS reviewed and negative.     Physical Exam/Data:   Vitals:   02/11/20 0115 02/11/20 0130 02/11/20 0232 02/11/20 0601  BP: 100/84 116/86 130/89 129/78  Pulse: 87 86 82 78  Resp: 18 20 18    Temp:  98 F (36.7 C) 98.5 F (36.9 C) 98 F (36.7 C)  TempSrc:   Oral Oral  SpO2: 96% 98% 100% 93%  Weight:   131.9 kg   Height:   6\' 1"  (1.854 m)     Intake/Output Summary (Last 24 hours) at 02/11/2020 0816 Last data filed at 02/11/2020 0300 Gross per 24 hour  Intake 87.91 ml  Output --  Net 87.91 ml   Last 3 Weights 02/11/2020 02/10/2020 02/08/2020  Weight (lbs) 290 lb 11.2 oz 293 lb 3.4 oz 294 lb 3.2 oz  Weight (kg) 131.861 kg 133 kg 133.448 kg     Body mass index is 38.35 kg/m.   General:  Well nourished, well developed, in no acute distress HEENT: normal Lymph: no adenopathy Neck: no JVD Endocrine:  No thryomegaly Vascular: No carotid bruits; FA pulses 2+ bilaterally without  bruits  Cardiac:  normal S1, S2; RRR; no murmur  Lungs:  clear to auscultation bilaterally, no wheezing, rhonchi or rales  Abd: soft, nontender, no hepatomegaly  Ext: no edema Musculoskeletal:  No deformities, BUE and BLE strength normal and equal Skin: warm and dry  Neuro:  CNs 2-12 intact, no focal abnormalities noted Psych:  Normal affect   EKG:  The EKG was personally reviewed and demonstrates: wide complex tachycardia with RBBB morphology and inferior axis. Initial  sharp component of the QRS suggestive of purkinje related VT.  Telemetry:  Telemetry was personally reviewed and demonstrates:  Sinus rhythm.  01/11/2020 LHC  Codominant coronary anatomy.  Normal left main  Normal LAD that wraps around the left ventricular apex.  Normal dominant circumflex.  Nondominant right coronary.  Normal left ventricular systolic function with EF 55%.  LVEDP 18 mmHg.       Laboratory Data:  High Sensitivity Troponin:  No results for input(s): TROPONINIHS in the last 720 hours.   Chemistry Recent Labs  Lab 02/10/20 2318 02/11/20 0357  NA 137 140  K 3.7 4.2  CL 102 106  CO2 25 27  GLUCOSE 182* 153*  BUN 16 13  CREATININE 0.98 0.92  CALCIUM 9.5 9.4  GFRNONAA >60 >60  ANIONGAP 10 7    No results for input(s): PROT, ALBUMIN, AST, ALT, ALKPHOS, BILITOT in the last 168 hours. Hematology Recent Labs  Lab 02/10/20 2318 02/11/20 0357  WBC 8.5 9.4  RBC 4.96 4.83  HGB 13.5 12.9*  HCT 41.4 40.3  MCV 83.5 83.4  MCH 27.2 26.7  MCHC 32.6 32.0  RDW 13.8 13.6  PLT 295 269   BNPNo results for input(s): BNP, PROBNP in the last 168 hours.  DDimer No results for input(s): DDIMER in the last 168 hours.   Radiology/Studies:  No results found.   Assessment and Plan:   1. VT Same VT as during prior presentations. 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 Structurally normal heart by cath, mri, echo. Plan to stop amio now and increase CCB. Will also add back  metop succinate 25. Discussed 2 options from here: #1 Stop amio and bring back in 2-3 weeks for repeat EP study and possible ablation. This route maximizes the chance for induction given time for amiodarone to wash out. #2 Stop amiodarone and keep inpatient with EP study tomorrow although I am concerned I may not be able to induce the rhythm given the IV bolus and gtt of amiodarone.  After a long discussion with the patient and wife, we will plan to discharge today on an increased dose of verapamil and add back toprol xl. Will see him back in 2-3 weeks for repeat EP study and possible ablation. Will perform procedure with no/minimal sedation.  For questions or updates, please contact [facility] Please consult [email or web address] for contact info under    Signed, Lanier Prude, MD  02/11/2020 8:16 AM

## 2020-02-11 NOTE — Discharge Summary (Signed)
Discharge Summary    Patient ID: [name] MRN: [id number]; DOB: [birth year]  Admit date: 02/10/2020 Discharge date: 02/11/2020  Primary Care Provider: Maud Deed, PA  Primary Electrophysiologist:  Lanier Prude, MD   Discharge Diagnoses    Active Problems:   Ventricular tachycardia [facility])   Ventricular tachyarrhythmia [facility])  Diagnostic Studies/Procedures    None   History of Present Illness     Xavier Johnston is a 44 y.o. male with a hx of VT followed by Dr. Lalla Brothers.   He has a hx of VT and a structurally normal heart who presented to the ER 02/10/20 after an episode of tachycardia found to be in the same wide complex tachycardia that he experienced several months ago. He was putting a box in the garbage and experienced a strange sensation. He went to sit down and then felt the heart rate increase. He was not diaphoretic or lightheaded. He was transported to [facility] where the rhythm stopped with vagal maneuvers. While in the ER he experienced a second episode of the rhythm. This time he felt worse and he was diaphoretic. No presyncope or syncope.  He underwent an EP study on 01/08/2020 during which the VT was only transiently induced and not enough to be studied or ablated.  He had a cardiac MRI which showed a structurally normal heart without scar. Cardiac cath was normal. Loop was implanted.  He was last seen in follow up with Dr. Lalla Brothers 02/08/2020. At that appointment he was doing well on verapamil. The plan at that appointment was to follow up in 6 months.  In the ER last night he was loaded with amiodarone which terminated the second episode of VT.   This morning discussed his case with the patient and his wife via phone.   Hospital Course    Patient was initially seen by general cardiology for hospital admission with EP consultation.  Plan was for a repeat cardiac MRI and possible LHC and based on results, potential implantable defibrillator.  The  patient was then seen by Dr. Lalla Brothers and after long discussion about their options including  (#1) stopping amio and bring back in 2-3 weeks for repeat EP study and possible ablation. This route felt to maximize the chance for induction given time for amiodarone to wash out. ( #2) Stop amiodarone and keep inpatient with EP study tomorrow although there was concern that rhythm may not be inducible given the IV bolus and gtt of amiodarone.  After a long discussion with the patient and wife, plan is for discharge today on an increased dose of verapamil and add back toprol xl. Will see him back in 2-3 weeks for repeat EP study and possible ablation. Will perform procedure with no/minimal sedation  Medication plan: Increase Verapamil to 240 mg daily and metoprolol 25 mg twice daily with close follow-up. Will send a message to schedule Ep appointment as the offices are closed today and tomorrow for the holiday.   Consultants: None  The patient was seen and examined by Dr. Lalla Brothers who feels that he is stable and ready for discharge 02/11/2020.  Did the patient have an acute coronary syndrome (MI, NSTEMI, STEMI, etc) this admission?:  No                               Did the patient have a percutaneous coronary intervention (stent / angioplasty)?:  No.      Discharge  Vitals Blood pressure 113/69, pulse 73, temperature 97.9 F (36.6 C), temperature source Oral, resp. rate 20, height 6\' 1"  (1.854 m), weight 131.9 kg, SpO2 98 %.  Filed Weights   02/10/20 2300 02/11/20 0232  Weight: 133 kg 131.9 kg    Labs & Radiologic Studies    CBC Recent Labs    02/10/20 2318 02/11/20 0357  WBC 8.5 9.4  NEUTROABS 4.0  --   HGB 13.5 12.9*  HCT 41.4 40.3  MCV 83.5 83.4  PLT 295 269   Basic Metabolic Panel Recent Labs    02/13/20 2318 02/11/20 0357  NA 137 140  K 3.7 4.2  CL 102 106  CO2 25 27  GLUCOSE 182* 153*  BUN 16 13  CREATININE 0.98 0.92  CALCIUM 9.5 9.4  MG 2.3  --    Liver Function  Tests No results for input(s): AST, ALT, ALKPHOS, BILITOT, PROT, ALBUMIN in the last 72 hours. No results for input(s): LIPASE, AMYLASE in the last 72 hours. High Sensitivity Troponin:   No results for input(s): TROPONINIHS in the last 720 hours.  BNP Invalid input(s): POCBNP D-Dimer No results for input(s): DDIMER in the last 72 hours. Hemoglobin A1C No results for input(s): HGBA1C in the last 72 hours. Fasting Lipid Panel No results for input(s): CHOL, HDL, LDLCALC, TRIG, CHOLHDL, LDLDIRECT in the last 72 hours. Thyroid Function Tests No results for input(s): TSH, T4TOTAL, T3FREE, THYROIDAB in the last 72 hours.  Invalid input(s): FREET3 _____________  CUP 02/13/20 DEVICE CHECK  Result Date: 01/19/2020 ILR wound check in clinic. Steri strips removed. Wound well healed. Home monitor transmitting nightly. No episodes. Questions answered.  Disposition   Pt is being discharged home today in good condition.  Follow-up Plans & Appointments     Follow-up Information    13/04/2019, MD Follow up.   Specialties: Cardiology, Radiology Why: The office will call with date and time of appointment.  If you have not heard from the office by Tuesday, 02/16/2020 please call the office to inquire. Contact information: [address] [place] [phone]              Discharge Instructions    Call MD for:  difficulty breathing, headache or visual disturbances   Complete by: As directed    Call MD for:  extreme fatigue   Complete by: As directed    Call MD for:  hives   Complete by: As directed    Call MD for:  persistant dizziness or light-headedness   Complete by: As directed    Call MD for:  persistant nausea and vomiting   Complete by: As directed    Call MD for:  redness, tenderness, or signs of infection (pain, swelling, redness, odor or green/yellow discharge around incision site)   Complete by: As directed    Call MD for:  severe  uncontrolled pain   Complete by: As directed    Call MD for:  temperature >100.4   Complete by: As directed    Diet - low sodium heart healthy   Complete by: As directed    Increase activity slowly   Complete by: As directed      Discharge Medications   Allergies as of 02/11/2020   No Known Allergies     Medication List    TAKE these medications   metoprolol succinate 25 MG 24 hr tablet Commonly known as: TOPROL-XL Take 1 tablet (25 mg total) by mouth daily.  Start taking on: February 12, 2020   multivitamin with minerals Tabs tablet Take 1 tablet by mouth daily with breakfast.   naproxen sodium 220 MG tablet Commonly known as: ALEVE Take 220-440 mg by mouth 2 (two) times daily as needed (pain.).   verapamil 240 MG CR tablet Commonly known as: CALAN-SR Take 1 tablet (240 mg total) by mouth daily. Start taking on: February 12, 2020 What changed:   medication strength  how much to take        Outstanding Labs/Studies   None   Duration of Discharge Encounter   Greater than 30 minutes including physician time.  Signed, Georgie Chard, NP 02/11/2020, 9:18 AM

## 2020-02-11 NOTE — H&P (Signed)
CARDIOLOGY ADMISSION NOTE  Patient ID: Xavier Johnston MRN: [id number] DOB/AGE: 1976-01-30 44 y.o.  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  - s/p IV amiodarone bolus, currently on the drip, will continue that  - keep him NPO tentatively - replaced K 3.5 - continue home verapamil 120mg  daily - EP Consult in am to discuss further mx   HPI:  44 y/o male with no PMH other than recently diagnosed to have WCT (on EP study revealed VT) presenting with palpitations  Patient reports sudden onset of palpitations in the evening while watching TV and started having sweating, unesasy feeling. Checked his BP and pulse, both were elevated [place] In the ER- he was found to have WCT (VT at HR 202)- with vagal maneuver it broke. He was being observed there and had another episode which sustained and did not breatk. He was given IV metoprolol 5mg  followed by IV amiodarone bolus plus drip and converted to NSR and transferred to [facility] for further eval.  Patient reports he jus saw Dr 45 on 11/22, has ILR and did not have any episodes in btw. Denies chest pain, syncope, heart failure symptoms Family h/o CAD and SCD in father at young age.  He has undergone extensive work up as below   EKG: WCT- HR 205 The axis is extreme right axis, there is concordance and difficult to say AV disssociation as there appears to be a p wave prior to qrs but could be VA conduction. Initially, I suspected this was like a SVT with abberancy Regardless this is same as 8/5. At baseline he has RBBB.  Cardiac studies: January 11, 2020 cardiac MRI IMPRESSION: 1. Normal LV size, mild LVH,  and normal systolic function (EF 63%) 2. Normal RV size and systolic function (EF 51%) 3. RV insertion site late gadolinium enhancement, which is a nonspecific finding often seen in setting of elevated pulmonary pressures. No other LGE seen  January 11, 2020 left heart catheterization personally reviewed  Left coronary anatomy.  Normal left main  Normal LAD that wraps around the left ventricular apex.  Normal dominant circumflex.  Nondominant right coronary.  Normal left ventricular systolic function with EF 55%. LVEDP 18 mmHg.  EP study: 01/08/20 SURGEON: January 13, 2020, MD   PREPROCEDURE DIAGNOSIS: Wide-complex tachycardia  POSTPROCEDURE DIAGNOSIS: Ventricular tachycardia  PROCEDURES:  1. Comprehensive EP study.  2. Coronary sinus pacing and recording.  3. 3D electroanatomic mapping 4. Isuprel infusion for arrhythmia induction and maintenance  CONCLUSIONS:  1. Sinus rhythm upon presentation.  2.  Sustained ventricular tachycardia inducible at EP study 3. AV conduction appears normal 4. No early apparent complications.   Plan: Given the patient's sustained ventricular tachycardia and apparently structurally normal heart, I plan to admit the patient for a cardiac MRI and left heart catheterization.  Based on the results of the studies, we will determine the need for an implantable defibrillator first continued monitoring with an implantable loop recorder.  We will continue metoprolol for now.  Past Medical History:  Diagnosis Date  . Ventricular tachycardia [facility])     Past Surgical History:  Procedure Laterality Date  . LEFT HEART CATH AND CORONARY ANGIOGRAPHY N/A  01/11/2020   Procedure: LEFT HEART CATH AND CORONARY ANGIOGRAPHY;  Surgeon: Lyn Records, MD;  Location: [facility] INVASIVE CV LAB;  Service: Cardiovascular;  Laterality: N/A;  . LOOP RECORDER INSERTION N/A 01/12/2020   Procedure: LOOP RECORDER INSERTION;  Surgeon: Lanier Prude, MD;  Location: [facility]  INVASIVE CV LAB;  Service: Cardiovascular;  Laterality: N/A;  . SVT ABLATION N/A 01/08/2020   Procedure: SVT ABLATION;  Surgeon: Lanier Prude, MD;  Location: [facility] INVASIVE CV LAB;  Service: Cardiovascular;  Laterality: N/A;  . vastectomy      No Known Allergies No current facility-administered medications on file prior to encounter.   Current Outpatient Medications on File Prior to Encounter  Medication Sig Dispense Refill  . Multiple Vitamin (MULTIVITAMIN WITH MINERALS) TABS tablet Take 1 tablet by mouth daily with breakfast.    . naproxen sodium (ALEVE) 220 MG tablet Take 220-440 mg by mouth 2 (two) times daily as needed (pain.).    Marland Kitchen verapamil (CALAN-SR) 120 MG CR tablet Take 1 tablet (120 mg total) by mouth daily. 90 tablet 3   Social History   Socioeconomic History  . Marital status: Married    Spouse name: Not on file  . Number of children: Not on file  . Years of education: Not on file  . Highest education level: Not on file  Occupational History  . Not on file  Tobacco Use  . Smoking status: Never Smoker  . Smokeless tobacco: Never Used  Vaping Use  . Vaping Use: Never used  Substance and Sexual Activity  . Alcohol use: No  . Drug use: No  . Sexual activity: Yes  Other Topics Concern  . Not on file  Social History Narrative  . Not on file   Social Determinants of Health   Financial Resource Strain:   . Difficulty of Paying Living Expenses: Not on file  Food Insecurity:   . Worried About Programme researcher, broadcasting/film/video in the Last Year: Not on file  . Ran Out of Food in the Last Year: Not on file  Transportation Needs:   . Lack of Transportation (Medical): Not on file  . Lack of Transportation (Non-Medical): Not on file  Physical Activity:   . Days of Exercise per Week: Not on file  . Minutes of Exercise per Session: Not on file  Stress:   . Feeling of Stress : Not on file  Social Connections:   . Frequency of Communication with Friends and Family: Not on file  .  Frequency of Social Gatherings with Friends and Family: Not on file  . Attends Religious Services: Not on file  . Active Member of Clubs or Organizations: Not on file  . Attends Banker Meetings: Not on file  . Marital Status: Not on file  Intimate Partner Violence:   . Fear of Current or Ex-Partner: Not on file  . Emotionally Abused: Not on file  . Physically Abused: Not on file  . Sexually Abused: Not on file    Family History  Problem Relation Age of Onset  . Heart attack Father   . Sudden Cardiac Death Father   . Diabetes Maternal Grandmother   . Cancer Paternal Grandmother      Review of Systems: [y] = yes, [ ]  = no       General: Weight gain [] ; Weight loss [ ] ; Anorexia [ ] ; Fatigue [ ] ; Fever [ ] ; Chills [ ] ; Weakness [ ]     Cardiac: Chest pain/pressure [ ] ;  Resting SOB [ ] ; Exertional SOB [ ] ; Orthopnea [ ] ; Pedal Edema [ ] ; Palpitations [x ]; Syncope [ ] ; Presyncope [ ] ; Paroxysmal nocturnal dyspnea[ ]     Pulmonary: Cough [ ] ; Wheezing[ ] ; Hemoptysis[ ] ; Sputum [ ] ; Snoring [ ]     GI: Vomiting[ ] ; Dysphagia[ ] ; Melena[ ] ; Hematochezia [ ] ; Heartburn[ ] ; Abdominal pain [ ] ; Constipation [ ] ; Diarrhea [ ] ; BRBPR [ ]     GU: Hematuria[ ] ; Dysuria [ ] ; Nocturia[ ]   Vascular: Pain in legs with walking [ ] ; Pain in feet with lying flat [ ] ; Non-healing sores [ ] ; Stroke [ ] ; TIA [ ] ; Slurred speech [ ] ;    Neuro: Headaches[ ] ; Vertigo[ ] ; Seizures[ ] ; Paresthesias[ ] ;Blurred vision [ ] ; Diplopia [ ] ; Vision changes [ ]     Ortho/Skin: Arthritis [ ] ; Joint pain [ ] ; Muscle pain [ ] ; Joint swelling [ ] ; Back Pain [ ] ; Rash [ ]     Psych: Depression[ ] ; Anxiety[ ]     Heme: Bleeding problems [ ] ; Clotting disorders [ ] ; Anemia [ ]     Endocrine: Diabetes [ ] ; Thyroid dysfunction[ ]   Physical Exam: Blood pressure 130/89, pulse 82, temperature 98.5 F (36.9 C), temperature source Oral, resp. rate 18, height 6\' 1"  (1.854 m), weight 131.9 kg, SpO2 100 %.    GENERAL: Patient is afebrile, Vital signs reviewed, Well appearing, Patient appears comfortable, Alert and lucid. EYES: Normal inspection. HEENT:  normocephalic, atraumatic , normal ENT inspection. ORAL:  Moist NECK:  supple , normal inspection. CARD:  regular rate and rhythm, heart sounds normal. RESP:  no respiratory distress, breath sounds normal. ABD: soft, non tender to palpation, BS present, soft, no organomegaly or masses . BACK: non-tender. No CVA tenderness. MUSC:  normal ROM, non-tender , no pedal edema . SKIN: color normal, no rash, warm, dry . NEURO: awake & alert, lucid, no motor/sensory deficit. Gait stable. PSYCH: mood/affect normal.   Labs: Lab Results  Component Value Date   BUN 16 02/10/2020   Lab Results  Component Value Date   CREATININE 0.98 02/10/2020   Lab Results  Component Value Date   NA 137 02/10/2020   K 3.7 02/10/2020   CL 102 02/10/2020   CO2 25 02/10/2020   No results found for: TROPONINI Lab Results  Component Value Date   WBC 8.5 02/10/2020   HGB 13.5 02/10/2020   HCT 41.4 02/10/2020   MCV 83.5 02/10/2020   PLT 295 02/10/2020   Lab Results  Component Value Date   CHOL 190 05/12/2014   HDL 37 (L) 05/12/2014   LDLCALC 134 (H) 05/12/2014   TRIG 93 05/12/2014   CHOLHDL 5.1 05/12/2014   Lab Results  Component Value Date   ALT 12 05/12/2014   AST 15 05/12/2014   ALKPHOS 72 05/12/2014   BILITOT 0.5 05/12/2014      Radiology:  none    Signed: 02/11/2020, 5:12 AM

## 2020-02-11 NOTE — Consult Note (Signed)
Cardiology Consultation:   Patient ID: Xavier Johnston MRN: [id number]; DOB: [birth year]  Admit date: 02/10/2020 Date of Consult: 02/11/2020  Primary Care Provider: Maud Deed, PA  [facility] Electrophysiologist:  Xavier Prude, MD     History of Present Illness:   Xavier Johnston with VT and a structurally normal heart presented to the ER yesterday evening after an episode of tachycardia. Found to be in the same wide complex tachycardia that he experienced several months ago. He was putting a box in the garbage can and experienced a strange sensation. He went to sit down and then felt the heart rate increase. He was not diaphoretic or lightheaded. He was transported to [facility] where the rhythm stopped with vagal maneuvers. While in the ER he experienced a second episode of the rhythm. This time he felt worse and he was diaphoretic. No presyncope or syncope.  I have followed him in the outpatient setting. I did an EP study on 01/08/2020 during which the VT was only transiently induced and not enough to be studied or ablated.  He had a cardiac MRI which showed a structurally normal heart without scar. Cardiac cath was normal. Loop was implanted.  I last saw him on 02/08/2020. At that appointment he was doing well on verapamil. The plan at that appointment was to follow up in 6 months.  In the ER last night he was loaded with amiodarone which terminated the second episode of VT.   This morning discussed his case with the patient and his wife via phone.   Past Medical History:  Diagnosis Date  . Ventricular tachycardia [facility])     Past Surgical History:  Procedure Laterality Date  . LEFT HEART CATH AND CORONARY ANGIOGRAPHY N/A 01/11/2020   Procedure: LEFT HEART CATH AND CORONARY ANGIOGRAPHY;  Surgeon: Lyn Records, MD;  Location: [facility] INVASIVE CV LAB;  Service: Cardiovascular;  Laterality: N/A;  . LOOP RECORDER INSERTION N/A 01/12/2020   Procedure: LOOP RECORDER INSERTION;   Surgeon: Xavier Prude, MD;  Location: [facility] INVASIVE CV LAB;  Service: Cardiovascular;  Laterality: N/A;  . SVT ABLATION N/A 01/08/2020   Procedure: SVT ABLATION;  Surgeon: Xavier Prude, MD;  Location: [facility] INVASIVE CV LAB;  Service: Cardiovascular;  Laterality: N/A;  . vastectomy       Home Medications:  Prior to Admission medications   Medication Sig Start Date End Date Taking? Authorizing Provider  Multiple Vitamin (MULTIVITAMIN WITH MINERALS) TABS tablet Take 1 tablet by mouth daily with breakfast.    [provider]  naproxen sodium (ALEVE) 220 MG tablet Take 220-440 mg by mouth 2 (two) times daily as needed (pain.).    [provider]  verapamil (CALAN-SR) 120 MG CR tablet Take 1 tablet (120 mg total) by mouth daily. 02/03/20   Xavier Prude, MD    Inpatient Medications: Scheduled Meds: . heparin  5,000 Units Subcutaneous Q8H  . metoprolol succinate  25 mg Oral Daily  . verapamil  240 mg Oral Daily   Continuous Infusions: . amiodarone     PRN Meds:   Allergies:   No Known Allergies  Social History:   Social History   Socioeconomic History  . Marital status: Married    Spouse name: Not on file  . Number of children: Not on file  . Years of education: Not on file  . Highest education level: Not on file  Occupational History  . Not on file  Tobacco Use  . Smoking status: Never  Smoker  . Smokeless tobacco: Never Used  Vaping Use  . Vaping Use: Never used  Substance and Sexual Activity  . Alcohol use: No  . Drug use: No  . Sexual activity: Yes  Other Topics Concern  . Not on file  Social History Narrative  . Not on file   Social Determinants of Health   Financial Resource Strain:   . Difficulty of Paying Living Expenses: Not on file  Food Insecurity:   . Worried About Programme researcher, broadcasting/film/video in the Last Year: Not on file  . Ran Out of Food in the Last Year: Not on file  Transportation Needs:   . Lack of Transportation (Medical):  Not on file  . Lack of Transportation (Non-Medical): Not on file  Physical Activity:   . Days of Exercise per Week: Not on file  . Minutes of Exercise per Session: Not on file  Stress:   . Feeling of Stress : Not on file  Social Connections:   . Frequency of Communication with Friends and Family: Not on file  . Frequency of Social Gatherings with Friends and Family: Not on file  . Attends Religious Services: Not on file  . Active Member of Clubs or Organizations: Not on file  . Attends Banker Meetings: Not on file  . Marital Status: Not on file  Intimate Partner Violence:   . Fear of Current or Ex-Partner: Not on file  . Emotionally Abused: Not on file  . Physically Abused: Not on file  . Sexually Abused: Not on file    Family History:    Family History  Problem Relation Age of Onset  . Heart attack Father   . Sudden Cardiac Death Father   . Diabetes Maternal Grandmother   . Cancer Paternal Grandmother      ROS:  Please see the history of present illness.   All other ROS reviewed and negative.     Physical Exam/Data:   Vitals:   02/11/20 0115 02/11/20 0130 02/11/20 0232 02/11/20 0601  BP: 100/84 116/86 130/89 129/78  Pulse: 87 86 82 78  Resp: 18 20 18    Temp:  98 F (36.7 C) 98.5 F (36.9 C) 98 F (36.7 C)  TempSrc:   Oral Oral  SpO2: 96% 98% 100% 93%  Weight:   131.9 kg   Height:   6\' 1"  (1.854 m)     Intake/Output Summary (Last 24 hours) at 02/11/2020 0816 Last data filed at 02/11/2020 0300 Gross per 24 hour  Intake 87.91 ml  Output --  Net 87.91 ml   Last 3 Weights 02/11/2020 02/10/2020 02/08/2020  Weight (lbs) 290 lb 11.2 oz 293 lb 3.4 oz 294 lb 3.2 oz  Weight (kg) 131.861 kg 133 kg 133.448 kg     Body mass index is 38.35 kg/m.   General:  Well nourished, well developed, in no acute distress HEENT: normal Lymph: no adenopathy Neck: no JVD Endocrine:  No thryomegaly Vascular: No carotid bruits; FA pulses 2+ bilaterally without  bruits  Cardiac:  normal S1, S2; RRR; no murmur  Lungs:  clear to auscultation bilaterally, no wheezing, rhonchi or rales  Abd: soft, nontender, no hepatomegaly  Ext: no edema Musculoskeletal:  No deformities, BUE and BLE strength normal and equal Skin: warm and dry  Neuro:  CNs 2-12 intact, no focal abnormalities noted Psych:  Normal affect   EKG:  The EKG was personally reviewed and demonstrates: wide complex tachycardia with RBBB morphology and inferior axis. Initial  sharp component of the QRS suggestive of purkinje related VT.  Telemetry:  Telemetry was personally reviewed and demonstrates:  Sinus rhythm.  01/11/2020 LHC  Codominant coronary anatomy.  Normal left main  Normal LAD that wraps around the left ventricular apex.  Normal dominant circumflex.  Nondominant right coronary.  Normal left ventricular systolic function with EF 55%.  LVEDP 18 mmHg.       Laboratory Data:  High Sensitivity Troponin:  No results for input(s): TROPONINIHS in the last 720 hours.   Chemistry Recent Labs  Lab 02/10/20 2318 02/11/20 0357  NA 137 140  K 3.7 4.2  CL 102 106  CO2 25 27  GLUCOSE 182* 153*  BUN 16 13  CREATININE 0.98 0.92  CALCIUM 9.5 9.4  GFRNONAA >60 >60  ANIONGAP 10 7    No results for input(s): PROT, ALBUMIN, AST, ALT, ALKPHOS, BILITOT in the last 168 hours. Hematology Recent Labs  Lab 02/10/20 2318 02/11/20 0357  WBC 8.5 9.4  RBC 4.96 4.83  HGB 13.5 12.9*  HCT 41.4 40.3  MCV 83.5 83.4  MCH 27.2 26.7  MCHC 32.6 32.0  RDW 13.8 13.6  PLT 295 269   BNPNo results for input(s): BNP, PROBNP in the last 168 hours.  DDimer No results for input(s): DDIMER in the last 168 hours.   Radiology/Studies:  No results found.   Assessment and Plan:   1. VT Same VT as during prior presentations. 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 Structurally normal heart by cath, mri, echo. Plan to stop amio now and increase CCB. Will also add back  metop succinate 25. Discussed 2 options from here: #1 Stop amio and bring back in 2-3 weeks for repeat EP study and possible ablation. This route maximizes the chance for induction given time for amiodarone to wash out. #2 Stop amiodarone and keep inpatient with EP study tomorrow although I am concerned I may not be able to induce the rhythm given the IV bolus and gtt of amiodarone.  After a long discussion with the patient and wife, we will plan to discharge today on an increased dose of verapamil and add back toprol xl. Will see him back in 2-3 weeks for repeat EP study and possible ablation. Will perform procedure with no/minimal sedation.  For questions or updates, please contact [facility] Please consult [email or web address] for contact info under    Signed, Xavier Prude, MD  02/11/2020 8:16 AM

## 2020-02-15 ENCOUNTER — Telehealth: Payer: Self-pay

## 2020-02-15 ENCOUNTER — Ambulatory Visit (INDEPENDENT_AMBULATORY_CARE_PROVIDER_SITE_OTHER): Payer: BC Managed Care – PPO

## 2020-02-15 DIAGNOSIS — I472 Ventricular tachycardia, unspecified: Secondary | ICD-10-CM

## 2020-02-15 LAB — CUP PACEART REMOTE DEVICE CHECK
Date Time Interrogation Session: 20211129084653
Implantable Pulse Generator Implant Date: 20211026

## 2020-02-15 NOTE — Telephone Encounter (Signed)
-----   Message from Lanier Prude, MD sent at 02/11/2020  8:29 AM EST ----- Elvina Sidle, We need to get Mr Monestime added on to my lab schedule in the next 2-4 weeks for EP study and possible ablation. He came back in with VT last night. Instructions will be to stop the verapamil and metoprolol 5 days prior the procedure. Will need carto, ice and anaesthesia.  I don't need to see him prior to the scheduled procedure since I saw him this morning. Thanks,Cameron

## 2020-02-15 NOTE — Telephone Encounter (Addendum)
Carelink Alert for 2 tachy events logged 35 minutes and 1 hour 2 minutes with rates 220's to 240's bpm; ECG suggest VT. Also noted symptom event during event above. Per note patient is currently admitted    Sending to Dr. Lalla Brothers for [facility].  The 11/25 episode shows the transition and out of VT.    Per notes it appears he is already aware and planning for VT ablation.

## 2020-02-17 NOTE — Telephone Encounter (Signed)
Patient called in regards to getting this scheduled, advised that Xavier Johnston would be giving him a call when she has an opportunity and that it will likely not be until Monday 12/05. Patient verbalized understanding

## 2020-02-22 NOTE — Progress Notes (Signed)
Carelink Summary Report / Loop Recorder 

## 2020-02-26 NOTE — Telephone Encounter (Signed)
See patient messages for VT ablation work up.

## 2020-03-05 ENCOUNTER — Other Ambulatory Visit (HOSPITAL_COMMUNITY)
Admission: RE | Admit: 2020-03-05 | Discharge: 2020-03-05 | Disposition: A | Discharge status: Home / Self Care | Payer: BC Managed Care – PPO | Source: Ambulatory Visit | Attending: Cardiology | Admitting: Cardiology

## 2020-03-05 DIAGNOSIS — Z20822 Contact with and (suspected) exposure to covid-19: Secondary | ICD-10-CM | POA: Insufficient documentation

## 2020-03-05 DIAGNOSIS — Z01812 Encounter for preprocedural laboratory examination: Secondary | ICD-10-CM | POA: Insufficient documentation

## 2020-03-05 DIAGNOSIS — Z79899 Other long term (current) drug therapy: Secondary | ICD-10-CM | POA: Diagnosis not present

## 2020-03-05 DIAGNOSIS — R000 Tachycardia, unspecified: Secondary | ICD-10-CM | POA: Diagnosis present

## 2020-03-05 LAB — SARS CORONAVIRUS 2 (TAT 6-24 HRS): SARS Coronavirus 2: NEGATIVE

## 2020-03-06 NOTE — Anesthesia Preprocedure Evaluation (Addendum)
Anesthesia Evaluation  Patient identified by MRN, date of birth, ID band Patient awake    Reviewed: Allergy & Precautions, NPO status , Patient's Chart, lab work & pertinent test results  History of Anesthesia Complications Negative for: history of anesthetic complications  Airway Mallampati: II  TM Distance: >3 FB Neck ROM: Full    Dental  (+) Dental Advisory Given   Pulmonary neg pulmonary ROS,    Pulmonary exam normal        Cardiovascular (-) CAD Normal cardiovascular exam+ dysrhythmias Ventricular Tachycardia    '21 TTE - EF 60 to 65%.  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     Neuro/Psych negative neurological ROS  negative psych ROS   GI/Hepatic negative GI ROS, Neg liver ROS,   Endo/Other   Obesity   Renal/GU negative Renal ROS     Musculoskeletal negative musculoskeletal ROS (+)   Abdominal   Peds  Hematology negative hematology ROS (+)   Anesthesia Other Findings Covid test negative   Reproductive/Obstetrics                            Anesthesia Physical Anesthesia Plan  ASA: III  Anesthesia Plan: MAC   Post-op Pain Management:    Induction: Intravenous  PONV Risk Score and Plan: 2 and Propofol infusion and Treatment may vary due to age or medical condition  Airway Management Planned: Nasal Cannula and Natural Airway  Additional Equipment: None  Intra-op Plan:   Post-operative Plan:   Informed Consent: I have reviewed the patients History and Physical, chart, labs and discussed the procedure including the risks, benefits and alternatives for the proposed anesthesia with the patient or authorized representative who has indicated his/her understanding and acceptance.       Plan Discussed with: CRNA and Anesthesiologist  Anesthesia Plan Comments:        Anesthesia Quick Evaluation

## 2020-03-07 ENCOUNTER — Other Ambulatory Visit: Payer: Self-pay

## 2020-03-07 ENCOUNTER — Ambulatory Visit (HOSPITAL_COMMUNITY): Payer: BC Managed Care – PPO | Admitting: Anesthesiology

## 2020-03-07 ENCOUNTER — Encounter (HOSPITAL_COMMUNITY)
Admission: RE | Disposition: A | Discharge status: Home / Self Care | Payer: Self-pay | Source: Home / Self Care | Attending: Cardiology

## 2020-03-07 ENCOUNTER — Ambulatory Visit (HOSPITAL_COMMUNITY)
Admission: RE | Admit: 2020-03-07 | Discharge: 2020-03-07 | Disposition: A | Discharge status: Home / Self Care | Payer: BC Managed Care – PPO | Attending: Cardiology | Admitting: Cardiology

## 2020-03-07 DIAGNOSIS — Z79899 Other long term (current) drug therapy: Secondary | ICD-10-CM | POA: Insufficient documentation

## 2020-03-07 DIAGNOSIS — Z20822 Contact with and (suspected) exposure to covid-19: Secondary | ICD-10-CM | POA: Insufficient documentation

## 2020-03-07 DIAGNOSIS — R000 Tachycardia, unspecified: Secondary | ICD-10-CM | POA: Diagnosis not present

## 2020-03-07 DIAGNOSIS — I472 Ventricular tachycardia: Secondary | ICD-10-CM | POA: Diagnosis not present

## 2020-03-07 HISTORY — PX: V TACH ABLATION: EP1227

## 2020-03-07 SURGERY — V TACH ABLATION
Anesthesia: Monitor Anesthesia Care

## 2020-03-07 MED ORDER — ACETAMINOPHEN 325 MG PO TABS: 650.0000 mg | ORAL_TABLET | ORAL | Status: DC | PRN

## 2020-03-07 MED ORDER — BUPIVACAINE HCL (PF) 0.25 % IJ SOLN
INTRAMUSCULAR | Status: AC
  Filled 2020-03-07: qty 30

## 2020-03-07 MED ORDER — SODIUM CHLORIDE 0.9 % IV SOLN: INTRAVENOUS | Status: DC

## 2020-03-07 MED ORDER — HEPARIN (PORCINE) IN NACL 1000-0.9 UT/500ML-% IV SOLN
INTRAVENOUS | Status: AC
  Filled 2020-03-07: qty 500

## 2020-03-07 MED ORDER — ISOPROTERENOL HCL 0.2 MG/ML IJ SOLN
INTRAMUSCULAR | Status: AC
  Filled 2020-03-07: qty 5

## 2020-03-07 MED ORDER — METOPROLOL SUCCINATE ER 25 MG PO TB24
25.0000 mg | ORAL_TABLET | Freq: Every day | ORAL | Status: DC
  Administered 2020-03-07: 25 mg via ORAL
  Filled 2020-03-07: qty 1

## 2020-03-07 MED ORDER — SODIUM CHLORIDE 0.9 % IV SOLN: 250.0000 mL | INTRAVENOUS | Status: DC | PRN

## 2020-03-07 MED ORDER — BUPIVACAINE HCL (PF) 0.25 % IJ SOLN
INTRAMUSCULAR | Status: DC | PRN
  Administered 2020-03-07: 30 mL

## 2020-03-07 MED ORDER — VERAPAMIL HCL ER 240 MG PO TBCR
240.0000 mg | EXTENDED_RELEASE_TABLET | Freq: Every day | ORAL | Status: DC
  Administered 2020-03-07: 240 mg via ORAL
  Filled 2020-03-07: qty 1

## 2020-03-07 MED ORDER — MIDAZOLAM HCL 2 MG/2ML IJ SOLN
INTRAMUSCULAR | Status: DC | PRN
  Administered 2020-03-07: 1 mg via INTRAVENOUS

## 2020-03-07 MED ORDER — HEPARIN (PORCINE) IN NACL 1000-0.9 UT/500ML-% IV SOLN
INTRAVENOUS | Status: DC | PRN
  Administered 2020-03-07: 500 mL

## 2020-03-07 MED ORDER — SODIUM CHLORIDE 0.9% FLUSH: 3.0000 mL | Freq: Two times a day (BID) | INTRAVENOUS | Status: DC

## 2020-03-07 MED ORDER — FENTANYL CITRATE (PF) 250 MCG/5ML IJ SOLN
INTRAMUSCULAR | Status: DC | PRN
  Administered 2020-03-07: 100 ug via INTRAVENOUS

## 2020-03-07 MED ORDER — SODIUM CHLORIDE 0.9% FLUSH: 3.0000 mL | INTRAVENOUS | Status: DC | PRN

## 2020-03-07 MED ORDER — ISOPROTERENOL HCL 0.2 MG/ML IJ SOLN
INTRAVENOUS | Status: DC | PRN
  Administered 2020-03-07: 2 ug/min via INTRAVENOUS

## 2020-03-07 MED ORDER — ONDANSETRON HCL 4 MG/2ML IJ SOLN: 4.0000 mg | Freq: Four times a day (QID) | INTRAMUSCULAR | Status: DC | PRN

## 2020-03-07 SURGICAL SUPPLY — 13 items
BLANKET WARM UNDERBOD FULL ACC (MISCELLANEOUS) ×1 IMPLANT
CATH CRD2 QUAD 6FR 5 (CATHETERS) ×1 IMPLANT
CATH DECANAV F CURVE (CATHETERS) ×1 IMPLANT
CATH JOSEPH QUAD ALLRED 6F REP (CATHETERS) ×1 IMPLANT
CLOSURE PERCLOSE PROSTYLE (VASCULAR PRODUCTS) ×3 IMPLANT
MAT PREVALON FULL STRYKER (MISCELLANEOUS) ×1 IMPLANT
PACK EP LATEX FREE (CUSTOM PROCEDURE TRAY) ×2
PACK EP LF (CUSTOM PROCEDURE TRAY) ×1 IMPLANT
PAD PRO RADIOLUCENT 2001M-C (PAD) ×2 IMPLANT
PATCH CARTO3 (PAD) ×1 IMPLANT
SHEATH PINNACLE 6F 10CM (SHEATH) ×1 IMPLANT
SHEATH PINNACLE 7F 10CM (SHEATH) ×1 IMPLANT
SHEATH PINNACLE 8F 10CM (SHEATH) ×1 IMPLANT

## 2020-03-07 NOTE — Interval H&P Note (Signed)
History and Physical Interval Note:  03/07/2020 8:08 AM  Xavier Johnston  has presented today for surgery, with the diagnosis of tachycardia.  The various methods of treatment have been discussed with the patient and family. After consideration of risks, benefits and other options for treatment, the patient has consented to  Procedure(s): V TACH ABLATION (N/A) / SVT Ablation as a surgical intervention.  The patient's history has been reviewed, patient examined, no change in status, stable for surgery.  I have reviewed the patient's chart and labs.  Questions were answered to the patient's satisfaction.     Jaidon Sponsel T Beatryce Colombo

## 2020-03-07 NOTE — Transfer of Care (Signed)
Immediate Anesthesia Transfer of Care Note  Patient: Xavier Johnston  Procedure(s) Performed: Stephanie Coup ABLATION (N/A )  Patient Location: Cath Lab  Anesthesia Type:MAC  Level of Consciousness: awake and patient cooperative  Airway & Oxygen Therapy: Patient Spontanous Breathing  Post-op Assessment: Report given to RN and Post -op Vital signs reviewed and stable  Post vital signs: Reviewed and stable  Last Vitals:  Vitals Value Taken Time  BP    Temp    Pulse 92 03/07/20 1219  Resp 9 03/07/20 1219  SpO2 100 % 03/07/20 1219  Vitals shown include unvalidated device data.  Last Pain:  Vitals:   03/07/20 0808  TempSrc:   PainSc: 0-No pain         Complications: No complications documented.

## 2020-03-07 NOTE — Anesthesia Postprocedure Evaluation (Signed)
Anesthesia Post Note  Patient: Xavier Johnston  Procedure(s) Performed: Stephanie Coup ABLATION (N/A )     Patient location during evaluation: PACU Anesthesia Type: MAC Level of consciousness: awake and alert Pain management: pain level controlled Vital Signs Assessment: post-procedure vital signs reviewed and stable Respiratory status: spontaneous breathing, nonlabored ventilation and respiratory function stable Cardiovascular status: stable and blood pressure returned to baseline Anesthetic complications: no   No complications documented.  Last Vitals:  Vitals:   03/07/20 1250 03/07/20 1310  BP: 115/83 129/81  Pulse: 98 91  Resp: 17 13  Temp:    SpO2: 100% 100%    Last Pain:  Vitals:   03/07/20 1312  TempSrc:   PainSc: 0-No pain                 Audry Pili

## 2020-03-07 NOTE — Progress Notes (Signed)
Up and walked and tolerated well; right groin stable, no bleeding or hematoma; Dr Lalla Brothers in earlier and ok to d/c home if no problems with ambulation

## 2020-03-07 NOTE — Discharge Instructions (Signed)
Post procedure care instructions No driving for 4 days. No lifting over 5 lbs for 1 week. No vigorous or sexual activity for 1 week. You may return to work/your usual activities on 03/14/2020. Keep procedure site clean & dry. If you notice increased pain, swelling, bleeding or pus, call/return!  You may shower after 24 hours, but no soaking in baths/hot tubs/pools for 1 week.   Cardiac Ablation, Care After  This sheet gives you information about how to care for yourself after your procedure. Your health care provider may also give you more specific instructions. If you have problems or questions, contact your health care provider. What can I expect after the procedure? After the procedure, it is common to have:  Bruising around your puncture site.  Tenderness around your puncture site.  Skipped heartbeats.  Tiredness (fatigue).  Follow these instructions at home: Puncture site care   Follow instructions from your health care provider about how to take care of your puncture site. Make sure you: ? If present, leave stitches (sutures), skin glue, or adhesive strips in place. These skin closures may need to stay in place for up to 2 weeks. If adhesive strip edges start to loosen and curl up, you may trim the loose edges. Do not remove adhesive strips completely unless your health care provider tells you to do that. ? If a large square bandage is present, this may be removed 24 hours after surgery.   Check your puncture site every day for signs of infection. Check for: ? Redness, swelling, or pain. ? Fluid or blood. If your puncture site starts to bleed, lie down on your back, apply firm pressure to the area, and contact your health care provider. ? Warmth. ? Pus or a bad smell. Driving  Do not drive for at least 4 days after your procedure or however long your health care provider recommends. (Do not resume driving if you have previously been instructed not to drive for other health  reasons.)  Do not drive or use heavy machinery while taking prescription pain medicine. Activity  Avoid activities that take a lot of effort for at least 7 days after your procedure.  Do not lift anything that is heavier than 5 lb (4.5 kg) for one week.   No sexual activity for 1 week.   Return to your normal activities as told by your health care provider. Ask your health care provider what activities are safe for you. General instructions  Take over-the-counter and prescription medicines only as told by your health care provider.  Do not use any products that contain nicotine or tobacco, such as cigarettes and e-cigarettes. If you need help quitting, ask your health care provider.  You may shower after 24 hours, but Do not take baths, swim, or use a hot tub for 1 week.   Do not drink alcohol for 24 hours after your procedure.  Keep all follow-up visits as told by your health care provider. This is important. Contact a health care provider if:  You have redness, mild swelling, or pain around your puncture site.  You have fluid or blood coming from your puncture site that stops after applying firm pressure to the area.  Your puncture site feels warm to the touch.  You have pus or a bad smell coming from your puncture site.  You have a fever.  You have chest pain or discomfort that spreads to your neck, jaw, or arm.  You are sweating a lot.  You feel  nauseous.  You have a fast or irregular heartbeat.  You have shortness of breath.  You are dizzy or light-headed and feel the need to lie down.  You have pain or numbness in the arm or leg closest to your puncture site. Get help right away if:  Your puncture site suddenly swells.  Your puncture site is bleeding and the bleeding does not stop after applying firm pressure to the area. These symptoms may represent a serious problem that is an emergency. Do not wait to see if the symptoms will go away. Get medical help  right away. Call your local emergency services (911 in the [place] Do not drive yourself to the hospital. Summary  After the procedure, it is normal to have bruising and tenderness at the puncture site in your groin, neck, or forearm.  Check your puncture site every day for signs of infection.  Get help right away if your puncture site is bleeding and the bleeding does not stop after applying firm pressure to the area. This is a medical emergency. This information is not intended to replace advice given to you by your health care provider. Make sure you discuss any questions you have with your health care provider.

## 2020-03-08 ENCOUNTER — Telehealth: Payer: Self-pay | Admitting: Cardiology

## 2020-03-08 ENCOUNTER — Encounter (HOSPITAL_COMMUNITY): Payer: Self-pay | Admitting: Cardiology

## 2020-03-08 NOTE — Telephone Encounter (Addendum)
Medical Records received payment and release form. Placed paperwork in Dr. Lovena Neighbours box . 03/17/20 jc Medical Record's Received Mr.Chapple's Disability Paperwork for Dr. Lalla Brothers to complete. I was able to talk to Mr. Gillian Scarce about payment and Release Form . He will try to come to the office on 12/23 to drop payment off.12/21. Paperwork was placed in the red folder in Medical Records. jc 12/21

## 2020-03-17 ENCOUNTER — Other Ambulatory Visit: Payer: Self-pay

## 2020-03-17 ENCOUNTER — Ambulatory Visit (INDEPENDENT_AMBULATORY_CARE_PROVIDER_SITE_OTHER): Payer: BC Managed Care – PPO

## 2020-03-17 DIAGNOSIS — I472 Ventricular tachycardia, unspecified: Secondary | ICD-10-CM

## 2020-03-17 DIAGNOSIS — Z0279 Encounter for issue of other medical certificate: Secondary | ICD-10-CM

## 2020-03-21 LAB — CUP PACEART REMOTE DEVICE CHECK
Date Time Interrogation Session: 20220101084456
Implantable Pulse Generator Implant Date: 20211026

## 2020-03-24 NOTE — Telephone Encounter (Signed)
Medical Records received the completed form & faxed from [employer]. Then found a restrictions form within the fax.  Put form in Dr. Lovena Neighbours box for completion. 03/24/20 KLM

## 2020-03-31 NOTE — Progress Notes (Signed)
Carelink Summary Report / Loop Recorder 

## 2020-04-05 ENCOUNTER — Other Ambulatory Visit: Payer: Self-pay

## 2020-04-05 ENCOUNTER — Encounter: Payer: Self-pay | Admitting: Cardiology

## 2020-04-05 ENCOUNTER — Telehealth (INDEPENDENT_AMBULATORY_CARE_PROVIDER_SITE_OTHER): Payer: BC Managed Care – PPO | Admitting: Cardiology

## 2020-04-05 VITALS — Ht 73.0 in | Wt 285.0 lb

## 2020-04-05 DIAGNOSIS — I472 Ventricular tachycardia: Secondary | ICD-10-CM | POA: Diagnosis not present

## 2020-04-05 DIAGNOSIS — I4510 Unspecified right bundle-branch block: Secondary | ICD-10-CM

## 2020-04-05 DIAGNOSIS — R000 Tachycardia, unspecified: Secondary | ICD-10-CM

## 2020-04-05 MED ORDER — VERAPAMIL HCL ER 240 MG PO TBCR: 240.0000 mg | EXTENDED_RELEASE_TABLET | Freq: Every day | ORAL | 2 refills | Status: DC

## 2020-04-05 MED ORDER — METOPROLOL SUCCINATE ER 25 MG PO TB24: 25.0000 mg | ORAL_TABLET | Freq: Every day | ORAL | 2 refills | Status: DC

## 2020-04-05 NOTE — Addendum Note (Signed)
Addended by: Baird Lyons on: 04/05/2020 08:22 AM   Modules accepted: Orders

## 2020-04-05 NOTE — Progress Notes (Addendum)
Virtual Visit via Telephone Note   This visit type was conducted due to national recommendations for restrictions regarding the COVID-19 Pandemic (e.g. social distancing) in an effort to limit this patient's exposure and mitigate transmission in our community.  Due to his co-morbid illnesses, this patient is at least at moderate risk for complications without adequate follow up.  This format is felt to be most appropriate for this patient at this time.  The patient did not have access to video technology/had technical difficulties with video requiring transitioning to audio format only (telephone).  All issues noted in this document were discussed and addressed.  No physical exam could be performed with this format.  Please refer to the patient's chart for his  consent to telehealth for [facility].    Date:  04/05/2020   ID:  Xavier Johnston, DOB Aug 03, 1975, MRN [id number] The patient was identified using 2 identifiers.  Patient Location: Home Provider Location: Home Office  PCP:  Xavier Johnston, [place]  Cardiologist:  No primary care provider on file.  Electrophysiologist:  Xavier Prude, MD   Evaluation Performed:  Follow-Up Visit  Chief Complaint:  SVT  History of Present Illness:    Xavier Johnston is a 45 y.o. male with recurrent symptomatic SVT with aberrancy and VT induced during an EP study presents for follow-up.  [name] had a second EP study performed on March 07, 2020.  During that study, ventricular tachycardia was not inducible as it was during a prior EP study.  During the study we were able to demonstrate aberrant conduction with rapid atrial pacing that perfectly reproduced the wide-complex morphology seen on his prior EKGs.  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  He is maintained on Toprol 25 mg daily and verapamil 240 mg  daily.  Today he tells me that he has not had a recurrent episode of SVT.  He has not noticed any side effects from the medical therapy.  He has been very active in the last few weeks.  He has been caring for 2 young children with his wife.  He took last week off to help watch the children and today is his first day back to work.   The patient does not have symptoms concerning for COVID-19 infection (fever, chills, cough, or new shortness of breath).    Past Medical History:  Diagnosis Date  . Ventricular tachycardia [facility])    Past Surgical History:  Procedure Laterality Date  . LEFT HEART CATH AND CORONARY ANGIOGRAPHY N/A 01/11/2020   Procedure: LEFT HEART CATH AND CORONARY ANGIOGRAPHY;  Surgeon: Lyn Records, MD;  Location: [facility] INVASIVE CV LAB;  Service: Cardiovascular;  Laterality: N/A;  . LOOP RECORDER INSERTION N/A 01/12/2020   Procedure: LOOP RECORDER INSERTION;  Surgeon: Xavier Prude, MD;  Location: [facility] INVASIVE CV LAB;  Service: Cardiovascular;  Laterality: N/A;  . SVT ABLATION N/A 01/08/2020   Procedure: SVT ABLATION;  Surgeon: Xavier Prude, MD;  Location: [facility] INVASIVE CV LAB;  Service: Cardiovascular;  Laterality: N/A;  Floyce Stakes ABLATION N/A 03/07/2020   Procedure: V TACH ABLATION;  Surgeon: Xavier Prude, MD;  Location: [facility] INVASIVE CV LAB;  Service: Cardiovascular;  Laterality: N/A;  . vastectomy       No outpatient medications have been marked as taking for the 04/05/20 encounter (Appointment) with Xavier Prude, MD.     Allergies:  Patient has no known allergies.   Social History   Tobacco Use  . Smoking status: Never Smoker  . Smokeless tobacco: Never Used  Vaping Use  . Vaping Use: Never used  Substance Use Topics  . Alcohol use: No  . Drug use: No     Family Hx: The patient's family history includes Cancer in his paternal grandmother; Diabetes in his maternal grandmother; Heart attack in his father; Sudden Cardiac Death in his  father.  ROS:   Please see the history of present illness.     All other systems reviewed and are negative.   Prior CV studies:   The following studies were reviewed today:  n/a  Labs/Other Tests and Data Reviewed:    EKG:  No ECG reviewed.  Recent Labs: 10/23/2019: TSH 1.964 02/10/2020: Magnesium 2.3 02/11/2020: BUN 13; Creatinine, Ser 0.92; Hemoglobin 12.9; Platelets 269; Potassium 4.2; Sodium 140   Recent Lipid Panel Lab Results  Component Value Date/Time   CHOL 190 05/12/2014 01:33 PM   TRIG 93 05/12/2014 01:33 PM   HDL 37 (L) 05/12/2014 01:33 PM   CHOLHDL 5.1 05/12/2014 01:33 PM   LDLCALC 134 (H) 05/12/2014 01:33 PM    Wt Readings from Last 3 Encounters:  03/07/20 285 lb (129.3 kg)  02/11/20 290 lb 11.2 oz (131.9 kg)  02/08/20 294 lb 3.2 oz (133.4 kg)     Risk Assessment/Calculations:      Objective:    Vital Signs:  There were no vitals taken for this visit.   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  He has had 2 EP studies.  From the data obtained during the EP studies I think the most likely explanation for [name] symptoms is a supraventricular tachycardia conducted with a right bundle branch block aberrancy.  He did have a ventricular tachycardia induced during the first EP study but this did not perfectly mirror the patient's EKG that has been seen on multiple occasions.  Furthermore, I was unable to reinduce the patient's VT during the second EP study.  He has a structurally normal heart on cardiac MRI, echo, coronary angiogram.  Unfortunately during the last EP study, I was unable to induce the SVT after an hour of pacing maneuvers and drug infusion.  He is maintained on metoprolol and verapamil and seems to be  tolerating these medications. Thankfully he has not had a recurrence of the svt. For now, plan to continue the metoprolol and verpamil. Will see him back in 6 months.     Time:   Today, I have spent 20 minutes with the patient with telehealth technology discussing the above problems.       Medication Adjustments/Labs and Tests Ordered: Current medicines are reviewed at length with the patient today.  Concerns regarding medicines are outlined above.   Tests Ordered: No orders of the defined types were placed in this encounter.   Medication Changes: No orders of the defined types were placed in this encounter.   Follow Up:  In Person in 6 month(s)  Signed, Xavier Prude, MD  04/05/2020 8:01 AM    [facility]

## 2020-04-12 NOTE — Addendum Note (Signed)
Addended by: Steffanie Dunn T on: 04/12/2020 09:16 AM   Modules accepted: Level of Service

## 2020-04-21 ENCOUNTER — Ambulatory Visit (INDEPENDENT_AMBULATORY_CARE_PROVIDER_SITE_OTHER): Payer: BC Managed Care – PPO

## 2020-04-21 DIAGNOSIS — I472 Ventricular tachycardia, unspecified: Secondary | ICD-10-CM

## 2020-04-21 LAB — CUP PACEART REMOTE DEVICE CHECK
Date Time Interrogation Session: 20220203084443
Implantable Pulse Generator Implant Date: 20211026

## 2020-04-27 NOTE — Progress Notes (Signed)
Carelink Summary Report / Loop Recorder 

## 2020-05-23 ENCOUNTER — Ambulatory Visit (INDEPENDENT_AMBULATORY_CARE_PROVIDER_SITE_OTHER): Payer: BC Managed Care – PPO

## 2020-05-23 DIAGNOSIS — I472 Ventricular tachycardia, unspecified: Secondary | ICD-10-CM

## 2020-05-25 LAB — CUP PACEART REMOTE DEVICE CHECK
Date Time Interrogation Session: 20220308084504
Implantable Pulse Generator Implant Date: 20211026

## 2020-06-01 NOTE — Progress Notes (Signed)
Carelink Summary Report / Loop Recorder 

## 2020-06-23 ENCOUNTER — Ambulatory Visit (INDEPENDENT_AMBULATORY_CARE_PROVIDER_SITE_OTHER): Payer: BC Managed Care – PPO

## 2020-06-23 DIAGNOSIS — I472 Ventricular tachycardia, unspecified: Secondary | ICD-10-CM

## 2020-06-27 LAB — CUP PACEART REMOTE DEVICE CHECK
Date Time Interrogation Session: 20220410084351
Implantable Pulse Generator Implant Date: 20211026

## 2020-07-06 NOTE — Progress Notes (Signed)
Carelink Summary Report / Loop Recorder 

## 2020-07-25 ENCOUNTER — Ambulatory Visit (INDEPENDENT_AMBULATORY_CARE_PROVIDER_SITE_OTHER): Payer: BC Managed Care – PPO

## 2020-07-25 DIAGNOSIS — I472 Ventricular tachycardia, unspecified: Secondary | ICD-10-CM

## 2020-07-29 LAB — CUP PACEART REMOTE DEVICE CHECK
Date Time Interrogation Session: 20220513084903
Implantable Pulse Generator Implant Date: 20211026

## 2020-08-17 NOTE — Progress Notes (Signed)
Carelink Summary Report / Loop Recorder 

## 2020-10-03 ENCOUNTER — Ambulatory Visit (INDEPENDENT_AMBULATORY_CARE_PROVIDER_SITE_OTHER): Payer: BC Managed Care – PPO

## 2020-10-03 DIAGNOSIS — I472 Ventricular tachycardia, unspecified: Secondary | ICD-10-CM

## 2020-10-04 LAB — CUP PACEART REMOTE DEVICE CHECK
Date Time Interrogation Session: 20220718084731
Implantable Pulse Generator Implant Date: 20211026

## 2020-10-26 NOTE — Progress Notes (Signed)
Carelink Summary Report / Loop Recorder 

## 2020-11-03 ENCOUNTER — Ambulatory Visit (INDEPENDENT_AMBULATORY_CARE_PROVIDER_SITE_OTHER): Payer: BC Managed Care – PPO

## 2020-11-03 DIAGNOSIS — I472 Ventricular tachycardia, unspecified: Secondary | ICD-10-CM

## 2020-11-04 ENCOUNTER — Telehealth: Payer: Self-pay | Admitting: Cardiology

## 2020-11-04 DIAGNOSIS — Z0279 Encounter for issue of other medical certificate: Secondary | ICD-10-CM

## 2020-11-04 NOTE — Telephone Encounter (Signed)
Patient came in today & dropped of FMLA paperworl to be completed. Forms from [employer] Arbour received on 11/04/2020. Completed patient auth attached. Took form to Dr. Lalla Brothers box for completion. 11/04/2020 JMM

## 2020-11-07 LAB — CUP PACEART REMOTE DEVICE CHECK
Date Time Interrogation Session: 20220820084320
Implantable Pulse Generator Implant Date: 20211026

## 2020-11-23 NOTE — Progress Notes (Signed)
Carelink Summary Report / Loop Recorder 

## 2020-11-24 ENCOUNTER — Telehealth: Payer: Self-pay | Admitting: Cardiology

## 2020-11-24 NOTE — Telephone Encounter (Signed)
Pt is following up on the status of the FMLA Paperwork from 11/03/20, pt states he paid the $29 and he have not heard from anyone.  Pt would also like a second opinion for a Heart Cath, pt would like a referral to be sent to Dr. Constance Haw [address] [place] [address] [place], [place] [address]  Phone# [phone] Fax# [phone]

## 2020-11-25 NOTE — Telephone Encounter (Signed)
See mychart message.  Also left detailed message.  FMLA paperwork complete and given to management for signature.

## 2020-11-28 ENCOUNTER — Telehealth: Payer: Self-pay | Admitting: Cardiology

## 2020-11-28 NOTE — Telephone Encounter (Signed)
Form completed by Dr. Lalla Brothers and faxed to [facility] on 11/28/2020, patient has been notified and ready for pick up. JMM 11/28/2020

## 2020-11-28 NOTE — Telephone Encounter (Signed)
FMLA paperwork signed by Dr. Lalla Brothers and given to medical records to call the patient to pick up

## 2020-12-05 ENCOUNTER — Ambulatory Visit (INDEPENDENT_AMBULATORY_CARE_PROVIDER_SITE_OTHER): Payer: BC Managed Care – PPO

## 2020-12-05 DIAGNOSIS — I472 Ventricular tachycardia, unspecified: Secondary | ICD-10-CM

## 2020-12-05 NOTE — Progress Notes (Signed)
Electrophysiology Office Follow up Visit Note:    Date:  12/06/2020   ID:  [name], DOB 1976-01-13, MRN [id number]  PCP:  Willeen Niece, PA  [facility] Cardiologist:  None  [facility] Electrophysiologist:  Vickie Epley, MD    Interval History:    Xavier Johnston is a 45 y.o. male who presents for a follow up visit.  I last saw the patient via telemedicine on April 05, 2020 for his wide-complex tachycardia.    I first met the patient October 23, 2019 when he presented for the wide-complex tachycardia.  He has a baseline right bundle branch block.  At the time of my initial meeting with him I felt like the most likely diagnosis was a aberrantly conducted SVT.  He was doing well on metoprolol at the time of discharge in August 2021.    I saw him for follow-up on November 26, 2019.  At that appointment we planned to do an EP study with possible ablation.  He presented for his ablation procedure on January 08, 2020.  HV interval was normal during that EP study.  During ventricular diagnostic EP catheter placement in RV pacing, a wide-complex tachycardia was induced with a cycle length of under 300 ms.  It was not entirely consistent with his clinical tachycardia.  He did not tolerate this tachycardia so I had to burst paced him out of the rhythm.  Unfortunately I was not able to induce any other tachycardias during that EP study.  I paced him on and off isoproterenol during the study.  With aggressive double extrastimuli, I was able to induce another nonclinical ventricular tachycardia that required RV apical pacing to terminate.  Given his structurally normal heart, I still felt like the most likely diagnosis was an aberrantly conducted SVT although I cannot completely exclude ventricular tachycardia.  At that time I transitioned him to verapamil from metoprolol and implanted a loop recorder for ongoing monitoring of his heart rhythm.  He had been evaluated with a left heart  catheterization and cardiac MRI previously-all of which were normal.  While on verapamil, he had a recurrent episode of wide-complex tachycardia and so I took him back to the EP lab for repeat EP study on March 07, 2020.  Intervals were normal again during the study.  I spent a great deal of time during this procedure trying to induce a tachycardia but was unsuccessful.  I tried on and off isoproterenol.  During the study, dual AV nodal physiology was noted but I was only able to observe a single typical echo beat at 400/250/210.  During this EP study, while burst pacing at 300 ms from the coronary sinus, the surface twelve-lead ECG was very similar to his clinical wide-complex tachycardia again supporting the diagnosis of an aberrantly conducted SVT.  Today he tells me he is doing well.  No recurrent episodes of arrhythmia since December 2021.     Past Medical History:  Diagnosis Date   Ventricular tachycardia [facility])     Past Surgical History:  Procedure Laterality Date   LEFT HEART CATH AND CORONARY ANGIOGRAPHY N/A 01/11/2020   Procedure: LEFT HEART CATH AND CORONARY ANGIOGRAPHY;  Surgeon: Belva Crome, MD;  Location: [place] CV LAB;  Service: Cardiovascular;  Laterality: N/A;   LOOP RECORDER INSERTION N/A 01/12/2020   Procedure: LOOP RECORDER INSERTION;  Surgeon: Vickie Epley, MD;  Location: [place] CV LAB;  Service: Cardiovascular;  Laterality: N/A;   SVT ABLATION N/A  01/08/2020   Procedure: SVT ABLATION;  Surgeon: Vickie Epley, MD;  Location: [place] CV LAB;  Service: Cardiovascular;  Laterality: N/A;   V TACH ABLATION N/A 03/07/2020   Procedure: V TACH ABLATION;  Surgeon: Vickie Epley, MD;  Location: [place] CV LAB;  Service: Cardiovascular;  Laterality: N/A;   vastectomy      Current Medications: No outpatient medications have been marked as taking for the 12/06/20 encounter (Office Visit) with Vickie Epley, MD.     Allergies:   Patient  has no known allergies.   Social History   Socioeconomic History   Marital status: Married    Spouse name: Not on file   Number of children: Not on file   Years of education: Not on file   Highest education level: Not on file  Occupational History   Not on file  Tobacco Use   Smoking status: Never   Smokeless tobacco: Never  Vaping Use   Vaping Use: Never used  Substance and Sexual Activity   Alcohol use: No   Drug use: No   Sexual activity: Yes  Other Topics Concern   Not on file  Social History Narrative   Not on file   Social Determinants of Health   Financial Resource Strain: Not on file  Food Insecurity: Not on file  Transportation Needs: Not on file  Physical Activity: Not on file  Stress: Not on file  Social Connections: Not on file     Family History: The patient's family history includes Cancer in his paternal grandmother; Diabetes in his maternal grandmother; Heart attack in his father; Sudden Cardiac Death in his father.  ROS:   Please see the history of present illness.    All other systems reviewed and are negative.  EKGs/Labs/Other Studies Reviewed:    The following studies were reviewed today:  Prior notes  January 11, 2020 cardiac MRI IMPRESSION: 1.  Normal LV size, mild LVH, and normal systolic function (EF 89%)  2.  Normal RV size and systolic function (EF 16%)  3. RV insertion site late gadolinium enhancement, which is a nonspecific finding often seen in setting of elevated pulmonary pressures. No other LGE seen    January 11, 2020 left heart catheterization Codominant coronary anatomy. Normal left main Normal LAD that wraps around the left ventricular apex. Normal dominant circumflex. Nondominant right coronary. Normal left ventricular systolic function with EF 55%.  LVEDP 18 mmHg.       EKG:  The ekg ordered today demonstrates sinus rhythm with a right bundle branch block.  PR intervals 204 ms.  Recent Labs: 02/10/2020:  Magnesium 2.3 02/11/2020: BUN 13; Creatinine, Ser 0.92; Hemoglobin 12.9; Platelets 269; Potassium 4.2; Sodium 140  Recent Lipid Panel    Component Value Date/Time   CHOL 190 05/12/2014 1333   TRIG 93 05/12/2014 1333   HDL 37 (L) 05/12/2014 1333   CHOLHDL 5.1 05/12/2014 1333   VLDL 19 05/12/2014 1333   LDLCALC 134 (H) 05/12/2014 1333    Physical Exam:    VS:  BP 124/76   Pulse 77   Ht 6' 1" (1.854 m)   Wt 297 lb 6.4 oz (134.9 kg)   SpO2 97%   BMI 39.24 kg/m     Wt Readings from Last 3 Encounters:  12/06/20 297 lb 6.4 oz (134.9 kg)  04/05/20 285 lb (129.3 kg)  03/07/20 285 lb (129.3 kg)     GEN:  Well nourished, well developed in no acute distress  HEENT: Normal NECK: No JVD; No carotid bruits LYMPHATICS: No lymphadenopathy CARDIAC: RRR, no murmurs, rubs, gallops RESPIRATORY:  Clear to auscultation without rales, wheezing or rhonchi  ABDOMEN: Soft, non-tender, non-distended MUSCULOSKELETAL:  No edema; No deformity  SKIN: Warm and dry NEUROLOGIC:  Alert and oriented x 3 PSYCHIATRIC:  Normal affect   ASSESSMENT:    1. Wide-complex tachycardia (HCC)   2. RBBB    PLAN:    In order of problems listed above:   #Wide-complex tachycardia I suspect he has an aberrantly conducted supraventricular tachycardia given his baseline right bundle branch block.  This was further supported by the twelve-lead EKG morphology observed during rapid CS pacing during the second EP study.  Despite prolonged attempts during 2 EP studies, I was unable to induce a sustained tachycardia.  For now, he is controlled on metoprolol and verapamil but would like to pursue treatment options that avoid long-term exposure to medications.  I think this is a very reasonable thought and desire.  His primary care physician is treated by [facility] electrophysiologist, Dr. Nancy Fetter.  The patient is asking whether or not he could have a referral.  I have encouraged him to seek the opinion of Dr. Nancy Fetter to see if there are  other treatment options available to him.  I have put in the referral and have asked him to reach out to [place] if there are any problems or questions regarding medical records being sent over to [facility] for review.  For now he will continue the metoprolol and verapamil.  Follow-up with me in 1 year or sooner as needed.        Medication Adjustments/Labs and Tests Ordered: Current medicines are reviewed at length with the patient today.  Concerns regarding medicines are outlined above.  Orders Placed This Encounter  Procedures   Ambulatory referral to Cardiac Electrophysiology   EKG 12-Lead   No orders of the defined types were placed in this encounter.    Signed, Lars Mage, MD, [facility], [facility] 12/06/2020 11:02 AM    Electrophysiology [facility]

## 2020-12-06 ENCOUNTER — Ambulatory Visit (INDEPENDENT_AMBULATORY_CARE_PROVIDER_SITE_OTHER): Payer: BC Managed Care – PPO | Admitting: Cardiology

## 2020-12-06 ENCOUNTER — Other Ambulatory Visit: Payer: Self-pay

## 2020-12-06 VITALS — BP 124/76 | HR 77 | Ht 73.0 in | Wt 297.4 lb

## 2020-12-06 DIAGNOSIS — I472 Ventricular tachycardia: Secondary | ICD-10-CM

## 2020-12-06 DIAGNOSIS — I4510 Unspecified right bundle-branch block: Secondary | ICD-10-CM

## 2020-12-06 DIAGNOSIS — R000 Tachycardia, unspecified: Secondary | ICD-10-CM

## 2020-12-06 NOTE — Patient Instructions (Signed)
Medication Instructions:  °Your physician recommends that you continue on your current medications as directed. Please refer to the Current Medication list given to you today. °*If you need a refill on your cardiac medications before your next appointment, please call your pharmacy* ° °Lab Work: °None ordered. °If you have labs (blood work) drawn today and your tests are completely normal, you will receive your results only by: °MyChart Message (if you have MyChart) OR °A paper copy in the mail °If you have any lab test that is abnormal or we need to change your treatment, we will call you to review the results. ° °Testing/Procedures: °None ordered. ° °Follow-Up: °At [facility], you and your health needs are our priority.  As part of our continuing mission to provide you with exceptional heart care, we have created designated Provider Care Teams.  These Care Teams include your primary Cardiologist (physician) and Advanced Practice Providers (APPs -  Physician Assistants and Nurse Practitioners) who all work together to provide you with the care you need, when you need it. ° °Your next appointment:   °Your physician wants you to follow-up in: one year  °You may see CAMERON T LAMBERT, MD or one of the following Advanced Practice Providers on your designated Care Team:   °Renee Ursuy, PA-C °Michael "Andy" Tillery, PA-C °You will receive a reminder letter in the mail two months in advance. If you don't receive a letter, please call our office to schedule the follow-up appointment. ° °

## 2020-12-08 LAB — CUP PACEART REMOTE DEVICE CHECK
Date Time Interrogation Session: 20220922084712
Implantable Pulse Generator Implant Date: 20211026

## 2020-12-12 NOTE — Progress Notes (Signed)
Carelink Summary Report / Loop Recorder 

## 2021-01-03 ENCOUNTER — Other Ambulatory Visit: Payer: Self-pay | Admitting: Cardiology

## 2021-01-05 ENCOUNTER — Other Ambulatory Visit: Payer: Self-pay | Admitting: Cardiology

## 2021-01-10 ENCOUNTER — Ambulatory Visit (INDEPENDENT_AMBULATORY_CARE_PROVIDER_SITE_OTHER): Payer: BC Managed Care – PPO

## 2021-01-10 DIAGNOSIS — I472 Ventricular tachycardia, unspecified: Secondary | ICD-10-CM

## 2021-01-10 LAB — CUP PACEART REMOTE DEVICE CHECK
Date Time Interrogation Session: 20221025084400
Implantable Pulse Generator Implant Date: 20211026

## 2021-01-18 NOTE — Progress Notes (Signed)
Carelink Summary Report / Loop Recorder 

## 2021-01-21 ENCOUNTER — Emergency Department (INDEPENDENT_AMBULATORY_CARE_PROVIDER_SITE_OTHER)
Admission: RE | Admit: 2021-01-21 | Discharge: 2021-01-21 | Disposition: A | Discharge status: Home / Self Care | Payer: BC Managed Care – PPO | Source: Ambulatory Visit

## 2021-01-21 ENCOUNTER — Other Ambulatory Visit: Payer: Self-pay

## 2021-01-21 VITALS — BP 131/75 | HR 80 | Temp 98.4°F | Resp 20 | Ht 73.0 in | Wt 295.0 lb

## 2021-01-21 DIAGNOSIS — J0100 Acute maxillary sinusitis, unspecified: Secondary | ICD-10-CM

## 2021-01-21 DIAGNOSIS — J309 Allergic rhinitis, unspecified: Secondary | ICD-10-CM | POA: Diagnosis not present

## 2021-01-21 HISTORY — DX: Supraventricular tachycardia, unspecified: I47.10

## 2021-01-21 HISTORY — DX: Supraventricular tachycardia: I47.1

## 2021-01-21 MED ORDER — FEXOFENADINE HCL 180 MG PO TABS: 180.0000 mg | ORAL_TABLET | Freq: Every day | ORAL | 0 refills | Status: DC

## 2021-01-21 MED ORDER — AMOXICILLIN 875 MG PO TABS: 875.0000 mg | ORAL_TABLET | Freq: Two times a day (BID) | ORAL | 0 refills | Status: AC

## 2021-01-21 NOTE — ED Provider Notes (Signed)
Xavier Johnston C[facility]    CSN: [id number] Arrival date & time: 01/21/21  1024      History   Chief Complaint Chief Complaint  Patient presents with   Cough    HPI Xavier Johnston is a 45 y.o. male.   HPI 45 year old male presents with cough and sinus nasal congestion for 1 week.  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   RBBB    Demand ischemia ([place])    Hyperlipidemia 05/13/2014    Past Surgical History:  Procedure Laterality Date   LEFT HEART CATH AND CORONARY ANGIOGRAPHY N/A 01/11/2020   Procedure: LEFT HEART CATH AND CORONARY ANGIOGRAPHY;  Surgeon: Belva Crome, MD;  Location: [facility];  Service: Cardiovascular;  Laterality: N/A;   LOOP RECORDER INSERTION N/A 01/12/2020   Procedure: LOOP RECORDER INSERTION;  Surgeon: Vickie Epley, MD;  Location: [facility];  Service: Cardiovascular;  Laterality: N/A;   SVT ABLATION N/A 01/08/2020   Procedure: SVT ABLATION;  Surgeon: Vickie Epley, MD;  Location: [facility] [facility];  Service: Cardiovascular;  Laterality: N/A;   V TACH ABLATION N/A 03/07/2020   Procedure: V TACH ABLATION;  Surgeon: Vickie Epley, MD;  Location: [facility];  Service: Cardiovascular;  Laterality: N/A;   vastectomy         Home Medications    Prior to Admission medications   Medication Sig Start Date End Date Taking? Authorizing Provider  amoxicillin (AMOXIL) 875 MG tablet Take 1 tablet (875 mg total) by mouth 2 (two) times daily for 7 days. 01/21/21 01/28/21 Yes Eliezer Lofts, FNP  fexofenadine [facility] ALLERGY) 180 MG tablet Take 1 tablet (180 mg total) by mouth daily for 15 days. 01/21/21 02/05/21 Yes Eliezer Lofts, FNP  metoprolol succinate (TOPROL-XL) 25 MG  24 hr tablet TAKE 1 TABLET(25 MG) BY MOUTH DAILY 01/04/21   Vickie Epley, MD  Multiple Vitamin (MULTIVITAMIN WITH MINERALS) TABS tablet Take 1 tablet by mouth daily with breakfast.    [provider]  naproxen sodium (ALEVE) 220 MG tablet Take 220-440 mg by mouth 2 (two) times daily as needed (pain.).    [provider]  verapamil (CALAN-SR) 240 MG CR tablet TAKE 1 TABLET(240 MG) BY MOUTH DAILY 01/04/21   Vickie Epley, MD    Family History Family History  Problem Relation Age of Onset   Hypertension Mother    Heart attack Father    Sudden Cardiac Death Father    Diabetes Maternal Grandmother    Cancer Paternal Grandmother     Social History Social History   Tobacco Use   Smoking status: Never   Smokeless tobacco: Never  Vaping Use   Vaping Use: Never used  Substance Use Topics   Alcohol use: No   Drug use: No     Allergies   Patient has no known allergies.   Review of Systems Review of Systems  HENT:  Positive for congestion.   Respiratory:  Positive for cough.   All other systems reviewed and are negative.   Physical Exam Triage Vital Signs ED Triage Vitals  Enc Vitals Group     BP 01/21/21 1113 131/75     Pulse Rate 01/21/21 1113 80     Resp 01/21/21  1113 20     Temp 01/21/21 1113 98.4 F (36.9 C)     Temp Source 01/21/21 1113 Oral     SpO2 01/21/21 1113 98 %     Weight 01/21/21 1108 295 lb (133.8 kg)     Height 01/21/21 1108 6\' 1"  (1.854 m)     Head Circumference --      Peak Flow --      Pain Score 01/21/21 1108 0     Pain Loc --      Pain Edu? --      Excl. in GC? --    No data found.  Updated Vital Signs BP 131/75 (BP Location: Right Arm)   Pulse 80   Temp 98.4 F (36.9 C) (Oral)   Resp 20   Ht 6\' 1"  (1.854 m)   Wt 295 lb (133.8 kg)   SpO2 98%   BMI 38.92 kg/m      Physical Exam Vitals and nursing note reviewed.  Constitutional:      General: He is not in acute distress.    Appearance: Normal  appearance. He is obese. He is not ill-appearing.  HENT:     Head: Normocephalic and atraumatic.     Right Ear: Tympanic membrane, ear canal and external ear normal.     Left Ear: Tympanic membrane, ear canal and external ear normal.     Mouth/Throat:     Mouth: Mucous membranes are moist.     Pharynx: Oropharynx is clear.  Eyes:     Extraocular Movements: Extraocular movements intact.     Conjunctiva/sclera: Conjunctivae normal.     Pupils: Pupils are equal, round, and reactive to light.  Cardiovascular:     Rate and Rhythm: Normal rate and regular rhythm.     Pulses: Normal pulses.     Heart sounds: Normal heart sounds.  Pulmonary:     Effort: Pulmonary effort is normal.     Breath sounds: Normal breath sounds.  Musculoskeletal:        General: Normal range of motion.     Cervical back: Normal range of motion and neck supple.  Skin:    General: Skin is warm and dry.  Neurological:     General: No focal deficit present.     Mental Status: He is alert and oriented to person, place, and time.     UC Treatments / Results  Labs (all labs ordered are listed, but only abnormal results are displayed) Labs Reviewed - No data to display  EKG   Radiology No results found.  Procedures Procedures (including critical care time)  Medications Ordered in UC Medications - No data to display  Initial Impression / Assessment and Plan / UC Course  I have reviewed the triage vital signs and the nursing notes.  Pertinent labs & imaging results that were available during my care of the patient were reviewed by me and considered in my medical decision making (see chart for details).     MDM: 1.  Subacute maxillary sinusitis-Amoxicillin; 2.  Allergic rhinitis-Rx'd Allegra. Advised patient to take medication as directed with food to completion.  Advised patient to take Allegra daily with first dose of antibiotic for the next 5 of 7 days.  May use Allegra as needed afterwards for  concurrent postnasal drainage/drip.  Encouraged patient increase daily water intake while taking these medications.  Discharged home, hemodynamically stable. Final Clinical Impressions(s) / UC Diagnoses   Final diagnoses:  Subacute maxillary sinusitis  Allergic rhinitis, unspecified  seasonality, unspecified trigger     Discharge Instructions      Advised patient to take medication as directed with food to completion.  Advised patient to take Allegra daily with first dose of antibiotic for the next 5 of 7 days.  May use Allegra as needed afterwards for concurrent postnasal drainage/drip.  Encouraged patient increase daily water intake while taking these medications.     ED Prescriptions     Medication Sig Dispense Auth. Provider   amoxicillin (AMOXIL) 875 MG tablet Take 1 tablet (875 mg total) by mouth 2 (two) times daily for 7 days. 14 tablet Eliezer Lofts, FNP   fexofenadine [facility] ALLERGY) 180 MG tablet Take 1 tablet (180 mg total) by mouth daily for 15 days. 15 tablet Eliezer Lofts, FNP      PDMP not reviewed this encounter.   Eliezer Lofts, [place] 01/21/21 1159

## 2021-01-21 NOTE — Discharge Instructions (Addendum)
Advised patient to take medication as directed with food to completion.  Advised patient to take Allegra daily with first dose of antibiotic for the next 5 of 7 days.  May use Allegra as needed afterwards for concurrent postnasal drainage/drip.  Encouraged patient increase daily water intake while taking these medications.

## 2021-01-21 NOTE — ED Triage Notes (Signed)
Pt presents to Urgent Care with c/o cough and nasal congestion x 1 week. Has not done home COVID test.

## 2021-02-12 LAB — CUP PACEART REMOTE DEVICE CHECK
Date Time Interrogation Session: 20221127084621
Implantable Pulse Generator Implant Date: 20211026

## 2021-02-13 ENCOUNTER — Ambulatory Visit (INDEPENDENT_AMBULATORY_CARE_PROVIDER_SITE_OTHER): Payer: BC Managed Care – PPO

## 2021-02-13 DIAGNOSIS — I472 Ventricular tachycardia, unspecified: Secondary | ICD-10-CM

## 2021-02-14 ENCOUNTER — Encounter: Payer: Self-pay | Admitting: Cardiology

## 2021-02-22 NOTE — Progress Notes (Signed)
Carelink Summary Report / Loop Recorder 

## 2021-03-21 ENCOUNTER — Ambulatory Visit (INDEPENDENT_AMBULATORY_CARE_PROVIDER_SITE_OTHER): Payer: BC Managed Care – PPO

## 2021-03-21 DIAGNOSIS — I472 Ventricular tachycardia, unspecified: Secondary | ICD-10-CM | POA: Diagnosis not present

## 2021-03-21 LAB — CUP PACEART REMOTE DEVICE CHECK
Date Time Interrogation Session: 20230102230749
Implantable Pulse Generator Implant Date: 20211026

## 2021-03-31 NOTE — Progress Notes (Signed)
Carelink Summary Report / Loop Recorder 

## 2021-04-24 ENCOUNTER — Ambulatory Visit (INDEPENDENT_AMBULATORY_CARE_PROVIDER_SITE_OTHER): Payer: BC Managed Care – PPO

## 2021-04-24 DIAGNOSIS — I472 Ventricular tachycardia, unspecified: Secondary | ICD-10-CM | POA: Diagnosis not present

## 2021-04-25 LAB — CUP PACEART REMOTE DEVICE CHECK
Date Time Interrogation Session: 20230205231217
Implantable Pulse Generator Implant Date: 20211026

## 2021-04-27 NOTE — Progress Notes (Signed)
Carelink Summary Report / Loop Recorder 

## 2021-05-29 ENCOUNTER — Ambulatory Visit (INDEPENDENT_AMBULATORY_CARE_PROVIDER_SITE_OTHER): Payer: BC Managed Care – PPO

## 2021-05-29 DIAGNOSIS — I472 Ventricular tachycardia, unspecified: Secondary | ICD-10-CM | POA: Diagnosis not present

## 2021-05-30 LAB — CUP PACEART REMOTE DEVICE CHECK
Date Time Interrogation Session: 20230312230715
Implantable Pulse Generator Implant Date: 20211026

## 2021-06-12 NOTE — Progress Notes (Signed)
Carelink Summary Report / Loop Recorder 

## 2021-07-03 ENCOUNTER — Ambulatory Visit (INDEPENDENT_AMBULATORY_CARE_PROVIDER_SITE_OTHER): Payer: BC Managed Care – PPO

## 2021-07-03 DIAGNOSIS — I472 Ventricular tachycardia, unspecified: Secondary | ICD-10-CM | POA: Diagnosis not present

## 2021-07-04 LAB — CUP PACEART REMOTE DEVICE CHECK
Date Time Interrogation Session: 20230414230909
Implantable Pulse Generator Implant Date: 20211026

## 2021-07-20 NOTE — Progress Notes (Signed)
Carelink Summary Report / Loop Recorder 

## 2021-08-04 LAB — CUP PACEART REMOTE DEVICE CHECK
Date Time Interrogation Session: 20230517231132
Implantable Pulse Generator Implant Date: 20211026

## 2021-08-07 ENCOUNTER — Ambulatory Visit (INDEPENDENT_AMBULATORY_CARE_PROVIDER_SITE_OTHER): Payer: BC Managed Care – PPO

## 2021-08-07 DIAGNOSIS — I472 Ventricular tachycardia, unspecified: Secondary | ICD-10-CM | POA: Diagnosis not present

## 2021-08-10 IMAGING — MR MR CARD MORPHOLOGY WO/W CM
45 of 48 series · 45 of 48 positions shown · IV contrast (gadavist)
Comparison: none

CLINICAL DATA: NSVT

EXAM:
CARDIAC MRI
TECHNIQUE: The patient was scanned on a 1.5 Tesla Siemens magnet. A dedicated
cardiac coil was used. Functional imaging was done using Fiesta
sequences. [DATE], and 4 chamber views were done to assess for RWMA's.
Modified Burra rule using a short axis stack was used to
calculate an ejection fraction on a dedicated work station using
Circle software. The patient received 10 cc of Gadavist. After 10
minutes inversion recovery sequences were used to assess for
infiltration and scar tissue.
CONTRAST:  10cc  of Gadavist

[Series 4: [id number] · axial · 8.0mm · 1.41mm/px · 1 of 16 slices shown]
[im 1/16]
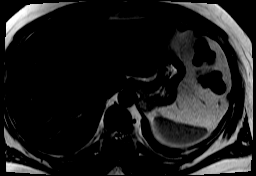

[Series 8: bSSFP · oblique · 8.0mm · 1.61mm/px · 1 of 25 slices shown (1 of 20)]
[im 1/25]
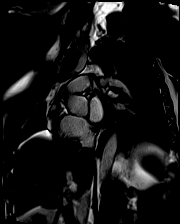

[Series 9: bSSFP · oblique · 8.0mm · 1.61mm/px · 1 of 25 slices shown (2 of 20)]
[im 1/25]
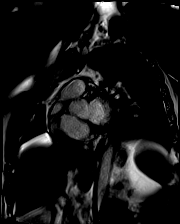

[Series 10: bSSFP · oblique · 8.0mm · 1.61mm/px · 1 of 25 slices shown (3 of 20)]
[im 1/25]
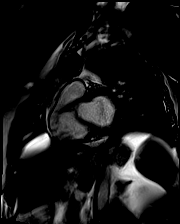

[Series 11: bSSFP · oblique · 8.0mm · 1.61mm/px · 1 of 25 slices shown (4 of 20)]
[im 1/25]
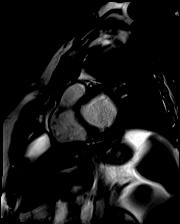

[Series 12: bSSFP · oblique · 8.0mm · 1.61mm/px · 1 of 25 slices shown (5 of 20)]
[im 1/25]
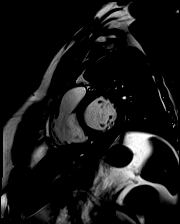

[Series 13: bSSFP · oblique · 8.0mm · 1.61mm/px · 1 of 25 slices shown (6 of 20)]
[im 1/25]
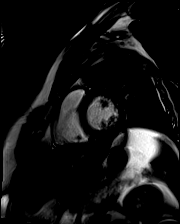

[Series 14: bSSFP · oblique · 8.0mm · 1.61mm/px · 1 of 25 slices shown (7 of 20)]
[im 1/25]
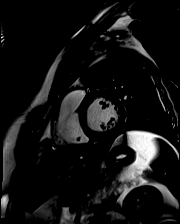

[Series 15: bSSFP · oblique · 8.0mm · 1.61mm/px · 1 of 25 slices shown (8 of 20)]
[im 1/25]
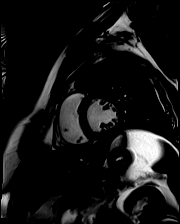

[Series 16: bSSFP · oblique · 8.0mm · 1.61mm/px · 1 of 25 slices shown (9 of 20)]
[im 1/25]
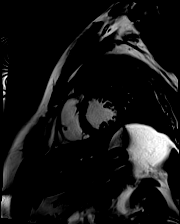

[Series 17: bSSFP · oblique · 8.0mm · 1.61mm/px · 1 of 25 slices shown (10 of 20)]
[im 1/25]
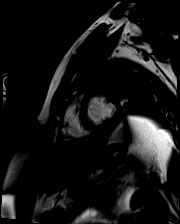

[Series 18: bSSFP · oblique · 8.0mm · 1.61mm/px · 1 of 25 slices shown (11 of 20)]
[im 1/25]
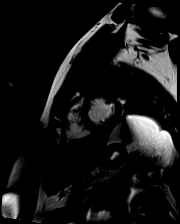

[Series 19: bSSFP · oblique · 8.0mm · 1.61mm/px · 1 of 25 slices shown (12 of 20)]
[im 1/25]
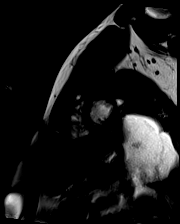

[Series 20: bSSFP · oblique · 8.0mm · 1.61mm/px · 1 of 25 slices shown (13 of 20)]
[im 1/25]
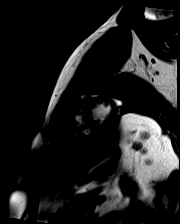

[Series 21: bSSFP · oblique · 8.0mm · 1.61mm/px · 1 of 25 slices shown (14 of 20)]
[im 1/25]
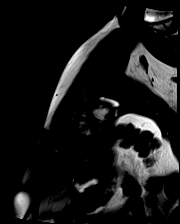

[Series 22: bSSFP · oblique · 8.0mm · 1.61mm/px · 1 of 25 slices shown (15 of 20)]
[im 1/25]
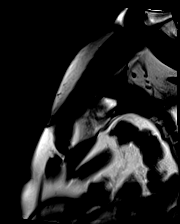

[Series 23: bSSFP · oblique · 8.0mm · 1.61mm/px · 1 of 25 slices shown (16 of 20)]
[im 1/25]
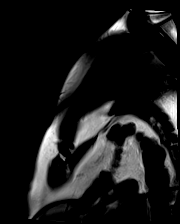

[Series 24: bSSFP · oblique · 8.0mm · 1.61mm/px · 1 of 25 slices shown (17 of 20)]
[im 1/25]
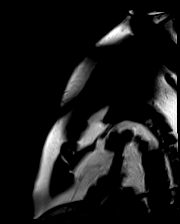

[Series 25: bSSFP · axial · 6.0mm · 1.41mm/px · 1 of 25 slices shown (18 of 20)]
[im 1/25]
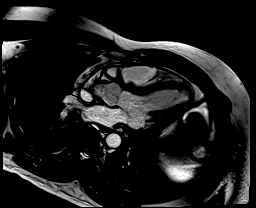

[Series 26: bSSFP · oblique · 6.0mm · 1.41mm/px · 1 of 25 slices shown (19 of 20)]
[im 1/25]
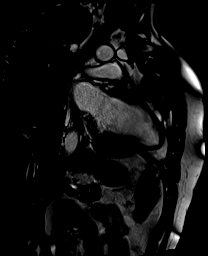

[Series 27: bSSFP · axial · 6.0mm · 1.41mm/px · 1 of 25 slices shown (20 of 20)]
[im 1/25]
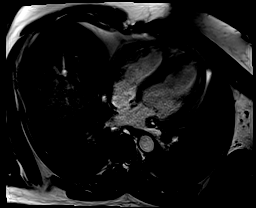

[Series 28: STIR · oblique · 8.0mm · 2.12mm/px · 1 of 16 slices shown]
[im 1/16]
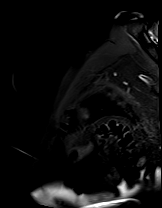

[Series 38: (id)_trufi · oblique · 8.0mm · 2.08mm/px · 1 of 9 slices shown]
[im 1/9]
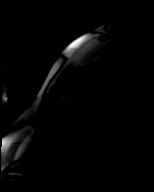

[Series 39: (id)_trufi_moco · oblique · 8.0mm · 2.08mm/px · 1 of 9 slices shown]
[im 1/9]
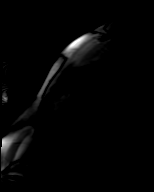

[Series 42: cine_trufi_cs_rt_short axis · oblique · 8.0mm · 1.92mm/px · 1 of 17 slices shown (1 of 16)]
[im 1/17]
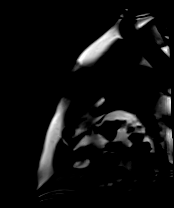

[Series 42: cine_trufi_cs_rt_short axis · oblique · 8.0mm · 1.92mm/px · 1 of 17 slices shown (2 of 16)]
[im 1/17]
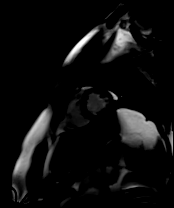

[Series 42: cine_trufi_cs_rt_short axis · oblique · 8.0mm · 1.92mm/px · 1 of 17 slices shown (3 of 16)]
[im 1/17]
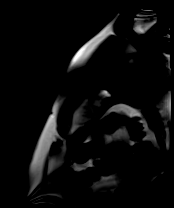

[Series 42: cine_trufi_cs_rt_short axis · oblique · 8.0mm · 1.92mm/px · 1 of 17 slices shown (4 of 16)]
[im 1/17]
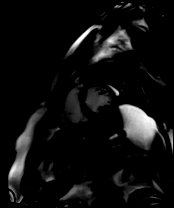

[Series 42: cine_trufi_cs_rt_short axis · oblique · 8.0mm · 1.92mm/px · 1 of 17 slices shown (5 of 16)]
[im 1/17]
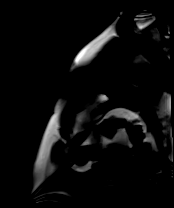

[Series 42: cine_trufi_cs_rt_short axis · oblique · 8.0mm · 1.92mm/px · 1 of 17 slices shown (6 of 16)]
[im 1/17]
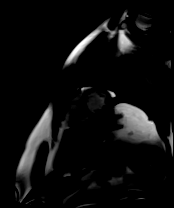

[Series 42: cine_trufi_cs_rt_short axis · oblique · 8.0mm · 1.92mm/px · 1 of 17 slices shown (7 of 16)]
[im 1/17]
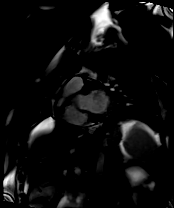

[Series 42: cine_trufi_cs_rt_short axis · oblique · 8.0mm · 1.92mm/px · 1 of 17 slices shown (8 of 16)]
[im 1/17]
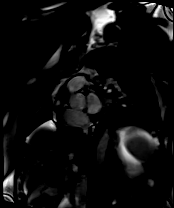

[Series 42: cine_trufi_cs_rt_short axis · oblique · 8.0mm · 1.92mm/px · 1 of 17 slices shown (9 of 16)]
[im 1/17]
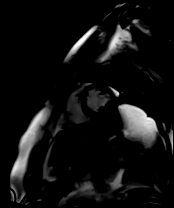

[Series 42: cine_trufi_cs_rt_short axis · oblique · 8.0mm · 1.92mm/px · 1 of 17 slices shown (10 of 16)]
[im 1/17]
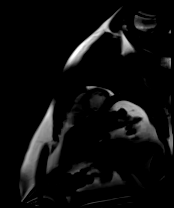

[Series 42: cine_trufi_cs_rt_short axis · oblique · 8.0mm · 1.92mm/px · 1 of 17 slices shown (11 of 16)]
[im 1/17]
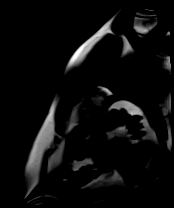

[Series 42: cine_trufi_cs_rt_short axis · oblique · 8.0mm · 1.92mm/px · 1 of 17 slices shown (12 of 16)]
[im 1/17]
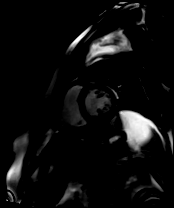

[Series 42: cine_trufi_cs_rt_short axis · oblique · 8.0mm · 1.92mm/px · 1 of 17 slices shown (13 of 16)]
[im 1/17]
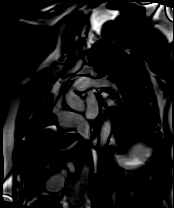

[Series 42: cine_trufi_cs_rt_short axis · oblique · 8.0mm · 1.92mm/px · 1 of 17 slices shown (14 of 16)]
[im 1/17]
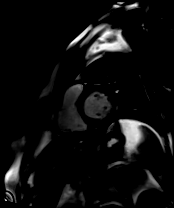

[Series 42: cine_trufi_cs_rt_short axis · oblique · 8.0mm · 1.92mm/px · 1 of 17 slices shown (15 of 16)]
[im 1/17]
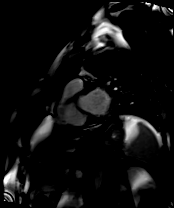

[Series 42: cine_trufi_cs_rt_short axis · oblique · 8.0mm · 1.92mm/px · 1 of 17 slices shown (16 of 16)]
[im 1/17]
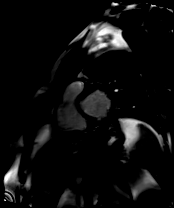

[Series 43: (id)_long_t1 · oblique · 8.0mm · 1.56mm/px · 1 of 24 slices shown]
[im 1/24]
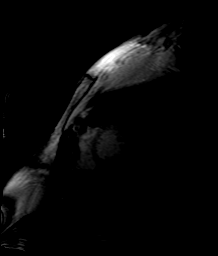

[Series 44: (id)_long_t1_moco · oblique · 8.0mm · 1.56mm/px · 1 of 24 slices shown]
[im 1/24]
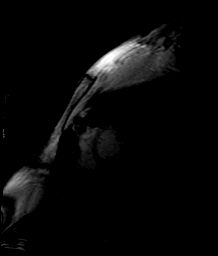

[Series 47: pre short axis · oblique · non-contrast · 8.0mm · 2.25mm/px · 1 of 10 slices shown (1 of 3)]
[im 1/10]
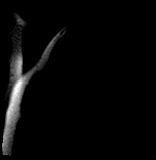

[Series 48: pre short axis · oblique · non-contrast · 8.0mm · 2.25mm/px · 1 of 10 slices shown (2 of 3)]
[im 1/10]
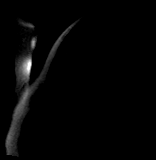

[Series 49: pre short axis · oblique · non-contrast · 8.0mm · 2.25mm/px · 1 of 10 slices shown (3 of 3)]
[im 1/10]
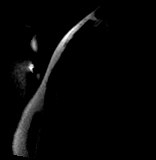

[45 of 48 positions shown; findings below may reference images not displayed]

FINDINGS: Left ventricle:

-Normal size

-Basal inferior myocardial cleft

-Mild LVH

-Normal systolic function

-Normal ECV (27%)

-RV insertion site LGE

LV EF: 63% (Normal 56-78%)

Absolute volumes:

LV EDV: 170mL (Normal 77-195 mL)

LV ESV: 63mL (Normal 19-72 mL)

LV SV: 107mL (Normal 51-133 mL)

CO: 6.5L/min (Normal 2.8-8.8 L/min)

Indexed volumes:

LV EDV: 66mL/sq-m (Normal 47-92 mL/sq-m)

LV ESV: 24mL/sq-m (Normal 13-30 mL/sq-m)

LV SV: 41mL/sq-m (Normal 32-62 mL/sq-m)

CI: 2.5L/min/sq-m (Normal 1.7-4.2 L/min/sq-m)

Right ventricle: Normal size and systolic function

RV EF:  51% (Normal 47-74%)

Absolute volumes:

RV EDV: 206mL (Normal 88-227 mL)

RV ESV: 101mL (Normal 23-103 mL)

RV SV: 105mL (Normal 52-138 mL)

CO: 6.4L/min (Normal 2.8-8.8 L/min)

Indexed volumes:

RV EDV: 80mL/sq-m (Normal 55-105 mL/sq-m)

RV ESV: 39mL/sq-m (Normal 15-43 mL/sq-m)

RV SV: 40mL/sq-m (Normal 32-64 mL/sq-m)

CI: 2.5L/min/sq-m (Normal 1.7-4.2 L/min/sq-m)

Left atrium: Mild dilatation

Right atrium: Normal size

Mitral valve: No regurgitation

Aortic valve: Tricuspid. No regurgitation

Tricuspid valve: No regurgitation

Pulmonic valve: No regurgitation

Aorta: Normal proximal ascending aorta

Pericardium: Normal
IMPRESSION: 1.  Normal LV size, mild LVH, and normal systolic function (EF 63%)

2.  Normal RV size and systolic function (EF 51%)

3. RV insertion site late gadolinium enhancement, which is a
nonspecific finding often seen in setting of elevated pulmonary
pressures. No other LGE seen

## 2021-08-23 NOTE — Progress Notes (Signed)
Carelink Summary Report / Loop Recorder 

## 2021-09-11 ENCOUNTER — Ambulatory Visit (INDEPENDENT_AMBULATORY_CARE_PROVIDER_SITE_OTHER): Payer: BC Managed Care – PPO

## 2021-09-11 DIAGNOSIS — I472 Ventricular tachycardia, unspecified: Secondary | ICD-10-CM

## 2021-09-12 LAB — CUP PACEART REMOTE DEVICE CHECK
Date Time Interrogation Session: 20230619231049
Implantable Pulse Generator Implant Date: 20211026

## 2021-10-06 NOTE — Progress Notes (Signed)
Carelink Summary Report / Loop Recorder 

## 2021-10-12 LAB — CUP PACEART REMOTE DEVICE CHECK
Date Time Interrogation Session: 20230722230419
Implantable Pulse Generator Implant Date: 20211026

## 2021-10-16 ENCOUNTER — Ambulatory Visit (INDEPENDENT_AMBULATORY_CARE_PROVIDER_SITE_OTHER): Payer: BC Managed Care – PPO

## 2021-10-16 DIAGNOSIS — I472 Ventricular tachycardia, unspecified: Secondary | ICD-10-CM | POA: Diagnosis not present

## 2021-11-19 NOTE — Progress Notes (Signed)
Carelink Summary Report / Loop Recorder 

## 2021-11-20 LAB — CUP PACEART REMOTE DEVICE CHECK
Date Time Interrogation Session: 20230830231249
Implantable Pulse Generator Implant Date: 20211026

## 2021-11-21 ENCOUNTER — Ambulatory Visit (INDEPENDENT_AMBULATORY_CARE_PROVIDER_SITE_OTHER): Payer: BC Managed Care – PPO

## 2021-11-21 DIAGNOSIS — I472 Ventricular tachycardia, unspecified: Secondary | ICD-10-CM | POA: Diagnosis not present

## 2021-12-14 NOTE — Progress Notes (Signed)
Carelink Summary Report / Loop Recorder 

## 2021-12-20 LAB — CUP PACEART REMOTE DEVICE CHECK
Date Time Interrogation Session: 20231003111943
Implantable Pulse Generator Implant Date: 20211026

## 2021-12-25 ENCOUNTER — Ambulatory Visit (INDEPENDENT_AMBULATORY_CARE_PROVIDER_SITE_OTHER): Payer: BC Managed Care – PPO

## 2021-12-25 DIAGNOSIS — I472 Ventricular tachycardia, unspecified: Secondary | ICD-10-CM

## 2022-01-03 NOTE — Progress Notes (Signed)
Carelink Summary Report / Loop Recorder 

## 2022-01-25 LAB — CUP PACEART REMOTE DEVICE CHECK
Date Time Interrogation Session: 20231104230622
Implantable Pulse Generator Implant Date: 20211026

## 2022-01-29 ENCOUNTER — Ambulatory Visit (INDEPENDENT_AMBULATORY_CARE_PROVIDER_SITE_OTHER): Payer: BC Managed Care – PPO

## 2022-01-29 DIAGNOSIS — I472 Ventricular tachycardia, unspecified: Secondary | ICD-10-CM

## 2022-03-05 ENCOUNTER — Ambulatory Visit (INDEPENDENT_AMBULATORY_CARE_PROVIDER_SITE_OTHER): Payer: BC Managed Care – PPO

## 2022-03-05 DIAGNOSIS — I472 Ventricular tachycardia, unspecified: Secondary | ICD-10-CM | POA: Diagnosis not present

## 2022-03-06 LAB — CUP PACEART REMOTE DEVICE CHECK
Date Time Interrogation Session: 20231217231737
Implantable Pulse Generator Implant Date: 20211026

## 2022-03-14 NOTE — Progress Notes (Signed)
Carelink Summary Report / Loop Recorder 

## 2022-04-09 ENCOUNTER — Ambulatory Visit: Payer: BC Managed Care – PPO | Attending: Cardiology

## 2022-04-09 DIAGNOSIS — I472 Ventricular tachycardia, unspecified: Secondary | ICD-10-CM | POA: Diagnosis not present

## 2022-04-11 LAB — CUP PACEART REMOTE DEVICE CHECK
Date Time Interrogation Session: 20240119230526
Implantable Pulse Generator Implant Date: 20211026

## 2022-04-11 NOTE — Progress Notes (Signed)
[facility] Summary Report / Loop Recorder

## 2022-05-14 ENCOUNTER — Ambulatory Visit: Payer: BC Managed Care – PPO

## 2022-05-14 DIAGNOSIS — I472 Ventricular tachycardia, unspecified: Secondary | ICD-10-CM

## 2022-05-15 LAB — CUP PACEART REMOTE DEVICE CHECK
Date Time Interrogation Session: 20240221231417
Implantable Pulse Generator Implant Date: 20211026

## 2022-05-28 NOTE — Progress Notes (Signed)
Carelink Summary Report / Loop Recorder 

## 2022-06-14 LAB — CUP PACEART REMOTE DEVICE CHECK
Date Time Interrogation Session: 20240325231021
Implantable Pulse Generator Implant Date: 20211026

## 2022-06-18 ENCOUNTER — Ambulatory Visit (INDEPENDENT_AMBULATORY_CARE_PROVIDER_SITE_OTHER): Payer: BC Managed Care – PPO

## 2022-06-18 DIAGNOSIS — I472 Ventricular tachycardia, unspecified: Secondary | ICD-10-CM

## 2022-06-25 NOTE — Progress Notes (Signed)
Carelink Summary Report / Loop Recorder 

## 2022-07-18 ENCOUNTER — Ambulatory Visit (INDEPENDENT_AMBULATORY_CARE_PROVIDER_SITE_OTHER): Payer: BC Managed Care – PPO

## 2022-07-18 DIAGNOSIS — I472 Ventricular tachycardia, unspecified: Secondary | ICD-10-CM

## 2022-07-18 LAB — CUP PACEART REMOTE DEVICE CHECK
Date Time Interrogation Session: 20240427230417
Implantable Pulse Generator Implant Date: 20211026

## 2022-07-23 ENCOUNTER — Ambulatory Visit: Payer: BC Managed Care – PPO

## 2022-07-26 NOTE — Progress Notes (Signed)
Carelink Summary Report / Loop Recorder 

## 2022-08-08 NOTE — Progress Notes (Signed)
Carelink Summary Report / Loop Recorder 

## 2022-08-20 ENCOUNTER — Ambulatory Visit (INDEPENDENT_AMBULATORY_CARE_PROVIDER_SITE_OTHER): Payer: BC Managed Care – PPO

## 2022-08-20 DIAGNOSIS — I472 Ventricular tachycardia, unspecified: Secondary | ICD-10-CM | POA: Diagnosis not present

## 2022-08-20 LAB — CUP PACEART REMOTE DEVICE CHECK
Date Time Interrogation Session: 20240602231001
Implantable Pulse Generator Implant Date: 20211026

## 2022-09-11 NOTE — Progress Notes (Signed)
Carelink Summary Report / Loop Recorder 

## 2022-09-24 ENCOUNTER — Ambulatory Visit (INDEPENDENT_AMBULATORY_CARE_PROVIDER_SITE_OTHER): Payer: BC Managed Care – PPO

## 2022-09-24 DIAGNOSIS — I472 Ventricular tachycardia, unspecified: Secondary | ICD-10-CM

## 2022-09-24 LAB — CUP PACEART REMOTE DEVICE CHECK
Date Time Interrogation Session: 20240705230516
Implantable Pulse Generator Implant Date: 20211026

## 2022-10-11 NOTE — Progress Notes (Signed)
Carelink Summary Report / Loop Recorder 

## 2022-10-29 ENCOUNTER — Ambulatory Visit (INDEPENDENT_AMBULATORY_CARE_PROVIDER_SITE_OTHER): Payer: BC Managed Care – PPO

## 2022-10-29 DIAGNOSIS — I472 Ventricular tachycardia, unspecified: Secondary | ICD-10-CM

## 2022-11-13 ENCOUNTER — Telehealth: Payer: Self-pay | Admitting: Cardiology

## 2022-11-13 NOTE — Telephone Encounter (Signed)
New Message:      Patient says he would like to have his Loop Recorder removed please.

## 2022-11-13 NOTE — Progress Notes (Signed)
Carelink Summary Report / Loop Recorder 

## 2022-11-14 NOTE — Telephone Encounter (Signed)
Left message for patient to call back  

## 2022-11-15 NOTE — Telephone Encounter (Signed)
Patient was returning call. Please advise ?

## 2022-11-16 NOTE — Telephone Encounter (Signed)
Patient would like for his loop recorder to be removed. I have scheduled him an appointment with Dr. Lalla Brothers for discussion and possible explant./

## 2022-12-03 ENCOUNTER — Ambulatory Visit (INDEPENDENT_AMBULATORY_CARE_PROVIDER_SITE_OTHER): Payer: BC Managed Care – PPO

## 2022-12-03 DIAGNOSIS — I472 Ventricular tachycardia, unspecified: Secondary | ICD-10-CM | POA: Diagnosis not present

## 2022-12-04 LAB — CUP PACEART REMOTE DEVICE CHECK
Date Time Interrogation Session: 20240913230627
Implantable Pulse Generator Implant Date: 20211026

## 2022-12-19 NOTE — Progress Notes (Signed)
Carelink Summary Report / Loop Recorder 

## 2022-12-25 NOTE — Progress Notes (Unsigned)
Electrophysiology Office Follow up Visit Note:    Date:  12/26/2022   ID:  Xavier Johnston, DOB [birth year], MRN [id number]  PCP:  Reubin Milan, PA-C  [facility] Cardiologist:  None  [facility] Electrophysiologist:  Lanier Prude, MD    Interval History:    Xavier Johnston is a 47 y.o. male who presents for a follow up visit.   I last saw the patient December 06, 2020.  He has a history of wide-complex tachycardia.  I had previously taken him for an EP study.  During that procedure, SVT was not inducible.  There were 2 wide-complex tachycardias that were nonclinical and very rapid.  These were not tolerated hemodynamically and required pace termination.  A loop recorder was implanted.  Repeat EP study at [facility] was performed.  He had a slow pathway modification.  Loop recorder tracings since device implant have shown no arrhythmias.  He presents today for loop recorder explant.  He has been doing well.  No recurrence of arrhythmia.     Past medical, surgical, social and family history were reviewed.  ROS:   Please see the history of present illness.    All other systems reviewed and are negative.  EKGs/Labs/Other Studies Reviewed:    The following studies were reviewed today:       Physical Exam:    VS:  BP 124/80 (BP Location: Left Arm, Patient Position: Sitting, Cuff Size: Large)   Pulse 88   Ht 6\' 1"  (1.854 m)   Wt 288 lb 8 oz (130.9 kg)   SpO2 98%   BMI 38.06 kg/m     Wt Readings from Last 3 Encounters:  12/26/22 288 lb 8 oz (130.9 kg)  01/21/21 295 lb (133.8 kg)  12/06/20 297 lb 6.4 oz (134.9 kg)     GEN:  Well nourished, well developed in no acute distress CARDIAC: RRR, no murmurs, rubs, gallops RESPIRATORY:  Clear to auscultation without rales, wheezing or rhonchi       ASSESSMENT:    1. Wide-complex tachycardia   2. SVT (supraventricular tachycardia) (HCC)   3. Implantable loop recorder present    PLAN:    In order of problems  listed above:  #Wide-complex tachycardia #SVT Doing well after SVT ablation at [facility].  During that procedure the slow pathway was modified.  History of EP study here at [facility] during which no SVT was inducible.  Has done well without recurrence of arrhythmia.  Presents for loop recorder explant.  I discussed loop recorder explant in detail with the patient including the risks and he wishes to proceed.       Signed, Steffanie Dunn, MD, [facility], [facility] 12/26/2022 8:43 AM    Electrophysiology [facility]  ---------------  SURGEON:  Steffanie Dunn, MD    PREPROCEDURE DIAGNOSIS: SVT, wide-complex tachycardia POSTPROCEDURE DIAGNOSIS: Same as preprocedure     PROCEDURES:   1. Implantable loop recorder explantation    INTRODUCTION:  Xavier Johnston is a 47 y.o. male with a history of wide-complex tachycardia NSVT who presents today for implantable loop explantation.  The patient previously had a loop recorder implanted.  The patient presents today for implantable loop explantation.  A preprocedure timeout was performed with RN Wenda Low).    DESCRIPTION OF PROCEDURE:  Informed written consent was obtained.  The patient required no sedation for the procedure today.   The patients left chest was therefore prepped and draped in the usual sterile fashion.  The skin overlying  the ILR monitor was infiltrated with lidocaine for local analgesia.  A 0.5-cm incision was made over the site.  The previously implanted ILR was exposed and removed using a combination of sharp and blunt dissection.  Steri- Strips and a sterile dressing were then applied. EBL<72ml.  There were no early apparent complications.     CONCLUSIONS:   1. Successful explantation of an implantable loop recorder   2. No early apparent complications.        Steffanie Dunn, MD 12/26/2022 8:46 AM

## 2022-12-26 ENCOUNTER — Encounter: Payer: Self-pay | Admitting: Cardiology

## 2022-12-26 ENCOUNTER — Ambulatory Visit: Payer: BC Managed Care – PPO | Attending: Cardiology | Admitting: Cardiology

## 2022-12-26 VITALS — BP 124/80 | HR 88 | Ht 73.0 in | Wt 288.5 lb

## 2022-12-26 DIAGNOSIS — R000 Tachycardia, unspecified: Secondary | ICD-10-CM

## 2022-12-26 DIAGNOSIS — Z95818 Presence of other cardiac implants and grafts: Secondary | ICD-10-CM

## 2022-12-26 DIAGNOSIS — I471 Supraventricular tachycardia, unspecified: Secondary | ICD-10-CM

## 2022-12-26 NOTE — Patient Instructions (Signed)
Medication Instructions:  Your physician recommends that you continue on your current medications as directed. Please refer to the Current Medication list given to you today.  Labwork: None ordered.  Testing/Procedures: None ordered.  Follow-Up: As needed    Implantable Loop Recorder Removal, Care After This sheet gives you information about how to care for yourself after your procedure. Your health care provider may also give you more specific instructions. If you have problems or questions, contact your health care provider. What can I expect after the procedure? After the procedure, it is common to have: Soreness or discomfort near the incision. Some swelling or bruising near the incision.  Follow these instructions at home: Incision care  Monitor your cardiac device site for redness, swelling, and drainage. Call the device clinic at [phone] if you experience these symptoms or fever/chills.  Keep the large square bandage on your site for 24 hours and then you may remove it yourself. Keep the steri-strips underneath in place.   You may shower after 72 hours / 3 days from your procedure with the steri-strips in place. They will usually fall off on their own, or may be removed after 10 days. Pat dry.   Avoid lotions, ointments, or perfumes over your incision until it is well-healed.  Please do not submerge in water until your site is completely healed.   If your wound site starts to bleed apply pressure.      If you have any questions/concerns please call the device clinic at [phone].  Activity  Return to your normal activities.  Contact a health care provider if: You have redness, swelling, or pain around your incision. You have a fever.

## 2023-01-07 ENCOUNTER — Ambulatory Visit: Payer: BC Managed Care – PPO

## 2023-01-07 DIAGNOSIS — I472 Ventricular tachycardia, unspecified: Secondary | ICD-10-CM

## 2023-01-08 LAB — CUP PACEART REMOTE DEVICE CHECK
Date Time Interrogation Session: 20241020230421
Implantable Pulse Generator Implant Date: 20211026

## 2023-01-24 NOTE — Progress Notes (Signed)
Carelink Summary Report / Loop Recorder 

## 2023-02-18 ENCOUNTER — Telehealth: Payer: Self-pay | Admitting: Cardiology

## 2023-03-27 ENCOUNTER — Other Ambulatory Visit: Payer: Self-pay

## 2023-03-27 ENCOUNTER — Ambulatory Visit
Admission: RE | Admit: 2023-03-27 | Discharge: 2023-03-27 | Disposition: A | Discharge status: Home / Self Care | Payer: 59 | Source: Ambulatory Visit | Attending: Family Medicine | Admitting: Family Medicine

## 2023-03-27 VITALS — BP 133/90 | HR 82 | Temp 98.5°F | Resp 18 | Ht 73.0 in | Wt 285.0 lb

## 2023-03-27 DIAGNOSIS — J209 Acute bronchitis, unspecified: Secondary | ICD-10-CM | POA: Diagnosis not present

## 2023-03-27 DIAGNOSIS — J019 Acute sinusitis, unspecified: Secondary | ICD-10-CM

## 2023-03-27 MED ORDER — BENZONATATE 200 MG PO CAPS
200.0000 mg | ORAL_CAPSULE | Freq: Three times a day (TID) | ORAL | 0 refills | Status: AC | PRN
Start: 2023-03-27 — End: ?

## 2023-03-27 MED ORDER — AZITHROMYCIN 250 MG PO TABS: ORAL_TABLET | ORAL | 0 refills | Status: AC

## 2023-03-27 NOTE — ED Triage Notes (Signed)
 Patient c/o non-productive cough, nasal drainage, no ear pain, afebrile x 10 days.  Patient has taken Chloricidin, Nightime flu pill, OTC cold meds.

## 2023-03-27 NOTE — ED Provider Notes (Signed)
 [facility]    CSN: [id number] Arrival date & time: 03/27/23  9177      History   Chief Complaint Chief Complaint  Patient presents with   Cough    I've had cold symptoms for over a week and a persistent cough is the only thing that remains - Entered by patient    HPI Xavier Johnston is a 48 y.o. male.   HPI  Patient states that he has had a cough and cold it has been going on for over a week and a half.  He has been taking Coricidin nighttime, cold medicines, Robitussin, Mucinex.  He is concerned because of the persistent cough.  No shortness of breath.  No chest pain.  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   RBBB    Demand ischemia (HCC)    Hyperlipidemia 05/13/2014    Past Surgical History:  Procedure Laterality Date   LEFT HEART CATH AND CORONARY ANGIOGRAPHY N/A 01/11/2020   Procedure: LEFT HEART CATH AND CORONARY ANGIOGRAPHY;  Surgeon: Claudene Victory ORN, MD;  Location: [facility] INVASIVE CV LAB;  Service: Cardiovascular;  Laterality: N/A;   LOOP RECORDER INSERTION N/A 01/12/2020   Procedure: LOOP RECORDER INSERTION;  Surgeon: Cindie Ole DASEN, MD;  Location: [facility] INVASIVE CV LAB;  Service: Cardiovascular;  Laterality: N/A;   SVT ABLATION N/A 01/08/2020   Procedure: SVT ABLATION;  Surgeon: Cindie Ole DASEN, MD;  Location: [facility] INVASIVE CV LAB;  Service: Cardiovascular;  Laterality: N/A;   V TACH ABLATION N/A 03/07/2020   Procedure: V TACH ABLATION;  Surgeon: Cindie Ole DASEN, MD;  Location: [facility] INVASIVE CV LAB;  Service: Cardiovascular;  Laterality: N/A;   vastectomy         Home Medications    Prior to Admission medications   Medication Sig Start Date End Date Taking? Authorizing  Provider  azithromycin  (ZITHROMAX  Z-PAK) 250 MG tablet Take two pills today followed by one a day until gone 03/27/23  Yes Maranda Jamee Jacob, MD  benzonatate  (TESSALON ) 200 MG capsule Take 1 capsule (200 mg total) by mouth 3 (three) times daily as needed for cough. 03/27/23  Yes Maranda Jamee Jacob, MD  lisinopril (ZESTRIL) 10 MG tablet Take 10 mg by mouth daily. 10/05/22  Yes [provider]  metoprolol  succinate (TOPROL -XL) 25 MG 24 hr tablet TAKE 1 TABLET(25 MG) BY MOUTH DAILY 01/04/21  Yes Cindie Ole DASEN, MD  Multiple Vitamin (MULTIVITAMIN WITH MINERALS) TABS tablet Take 1 tablet by mouth daily with breakfast.   Yes [provider]  naproxen  sodium (ALEVE ) 220 MG tablet Take 220-440 mg by mouth 2 (two) times daily as needed (pain.).   Yes [provider]  verapamil  (CALAN -SR) 240 MG CR tablet TAKE 1 TABLET(240 MG) BY MOUTH DAILY 01/04/21  Yes Cindie Ole DASEN, MD  metFORMIN (GLUCOPHAGE) 850 MG tablet Take 850 mg by mouth daily.   Yes [provider]    Family History Family History  Problem Relation Age of Onset   Hypertension Mother    Heart attack Father    Sudden Cardiac Death Father    Diabetes Maternal Grandmother    Cancer Paternal Grandmother     Social History Social History   Tobacco Use   Smoking status:  Never   Smokeless tobacco: Never  Vaping Use   Vaping status: Never Used  Substance Use Topics   Alcohol use: No   Drug use: No     Allergies   Patient has no known allergies.   Review of Systems Review of Systems See HPI  Physical Exam Triage Vital Signs ED Triage Vitals  Encounter Vitals Group     BP 03/27/23 0854 (!) 133/90     Systolic BP Percentile --      Diastolic BP Percentile --      Pulse Rate 03/27/23 0854 82     Resp 03/27/23 0854 18     Temp 03/27/23 0854 98.5 F (36.9 C)     Temp Source 03/27/23 0854 Oral     SpO2 03/27/23 0854 98 %     Weight 03/27/23 0855 285 lb (129.3 kg)     Height 03/27/23  0855 6' 1 (1.854 m)     Head Circumference --      Peak Flow --      Pain Score 03/27/23 0855 0     Pain Loc --      Pain Education --      Exclude from Growth Chart --    No data found.  Updated Vital Signs BP (!) 133/90 (BP Location: Right Arm)   Pulse 82   Temp 98.5 F (36.9 C) (Oral)   Resp 18   Ht 6' 1 (1.854 m)   Wt 129.3 kg   SpO2 98%   BMI 37.60 kg/m      Physical Exam Constitutional:      General: He is not in acute distress.    Appearance: He is well-developed.  HENT:     Head: Normocephalic and atraumatic.     Right Ear: Tympanic membrane and ear canal normal.     Left Ear: Tympanic membrane and ear canal normal.     Nose: Nose normal. No congestion.     Mouth/Throat:     Mouth: Mucous membranes are moist.     Pharynx: No posterior oropharyngeal erythema.  Eyes:     Conjunctiva/sclera: Conjunctivae normal.     Pupils: Pupils are equal, round, and reactive to light.  Neck:     Vascular: No carotid bruit.  Cardiovascular:     Rate and Rhythm: Normal rate and regular rhythm.     Heart sounds: Normal heart sounds.  Pulmonary:     Effort: Pulmonary effort is normal. No respiratory distress.     Breath sounds: Rhonchi present.  Abdominal:     General: There is no distension.     Palpations: Abdomen is soft.  Musculoskeletal:        General: Normal range of motion.     Cervical back: Normal range of motion.  Lymphadenopathy:     Cervical: No cervical adenopathy.  Skin:    General: Skin is warm and dry.  Neurological:     Mental Status: He is alert.      UC Treatments / Results  Labs (all labs ordered are listed, but only abnormal results are displayed) Labs Reviewed - No data to display  EKG   Radiology No results found.  Procedures Procedures (including critical care time)  Medications Ordered in UC Medications - No data to display  Initial Impression / Assessment and Plan / UC Course  I have reviewed the triage vital signs and  the nursing notes.  Pertinent labs & imaging results that were available during my care of  the patient were reviewed by me and considered in my medical decision making (see chart for details).     Final Clinical Impressions(s) / UC Diagnoses   Final diagnoses:  Acute sinusitis with symptoms > 10 days  Acute bronchitis, unspecified organism     Discharge Instructions      Take the antibiotic as directed.  2 pills today then 1 a day until gone Take Tessalon  2-3 times a day to help reduce cough. Tessalon  works best in combination with dextromethorphan.  Continue the Mucinex DM See your doctor if not improving by next week    ED Prescriptions     Medication Sig Dispense Auth. Provider   azithromycin  (ZITHROMAX  Z-PAK) 250 MG tablet Take two pills today followed by one a day until gone 6 tablet Maranda Jamee Jacob, MD   benzonatate  (TESSALON ) 200 MG capsule Take 1 capsule (200 mg total) by mouth 3 (three) times daily as needed for cough. 21 capsule Maranda Jamee Jacob, MD      PDMP not reviewed this encounter.   Maranda Jamee Jacob, MD 03/27/23 1302

## 2023-03-27 NOTE — Discharge Instructions (Addendum)
 Take the antibiotic as directed.  2 pills today then 1 a day until gone Take Tessalon 2-3 times a day to help reduce cough. Tessalon works best in combination with dextromethorphan.  Continue the Mucinex DM See your doctor if not improving by next week
